# Patient Record
Sex: Male | Born: 1990 | Race: Black or African American | Hispanic: No | Marital: Single | Smoking: Never smoker
Health system: Southern US, Community
[De-identification: ages and names within clinical notes are randomized; demographics above are authoritative.]

## PROBLEM LIST (undated history)

## (undated) ENCOUNTER — Ambulatory Visit (HOSPITAL_COMMUNITY): Admission: EM | Payer: Self-pay | Source: Home / Self Care

## (undated) DIAGNOSIS — B182 Chronic viral hepatitis C: Principal | ICD-10-CM

## (undated) HISTORY — DX: Chronic viral hepatitis C: B18.2

---

## 2018-04-07 ENCOUNTER — Other Ambulatory Visit (HOSPITAL_COMMUNITY)
Admission: RE | Admit: 2018-04-07 | Discharge: 2018-04-07 | Disposition: A | Discharge status: Home / Self Care | Payer: Medicaid Other | Source: Ambulatory Visit | Attending: Family Medicine | Admitting: Family Medicine

## 2018-04-07 ENCOUNTER — Ambulatory Visit (INDEPENDENT_AMBULATORY_CARE_PROVIDER_SITE_OTHER): Payer: Medicaid Other | Admitting: Family Medicine

## 2018-04-07 VITALS — BP 98/62 | HR 69 | Temp 97.9°F | Ht 69.0 in | Wt 145.4 lb

## 2018-04-07 DIAGNOSIS — R300 Dysuria: Secondary | ICD-10-CM | POA: Insufficient documentation

## 2018-04-07 DIAGNOSIS — Z0289 Encounter for other administrative examinations: Secondary | ICD-10-CM

## 2018-04-07 DIAGNOSIS — R369 Urethral discharge, unspecified: Secondary | ICD-10-CM | POA: Insufficient documentation

## 2018-04-07 DIAGNOSIS — R309 Painful micturition, unspecified: Secondary | ICD-10-CM | POA: Diagnosis not present

## 2018-04-07 DIAGNOSIS — Z758 Other problems related to medical facilities and other health care: Secondary | ICD-10-CM

## 2018-04-07 DIAGNOSIS — Z789 Other specified health status: Secondary | ICD-10-CM | POA: Insufficient documentation

## 2018-04-07 HISTORY — DX: Encounter for other administrative examinations: Z02.89

## 2018-04-07 MED ORDER — AZITHROMYCIN 500 MG PO TABS: 500.0000 mg | ORAL_TABLET | Freq: Once | ORAL | Status: DC

## 2018-04-07 MED ORDER — CEFTRIAXONE SODIUM 250 MG IJ SOLR
250.0000 mg | Freq: Once | INTRAMUSCULAR | Status: AC
  Administered 2018-04-07: 250 mg via INTRAMUSCULAR

## 2018-04-07 MED ORDER — AZITHROMYCIN 500 MG PO TABS
1000.0000 mg | ORAL_TABLET | Freq: Once | ORAL | Status: AC
  Administered 2018-04-07: 1000 mg via ORAL

## 2018-04-07 NOTE — Patient Instructions (Signed)
Thank you for coming!  We made you an appointment in 2-3 weeks to see how you are doing.

## 2018-04-07 NOTE — Progress Notes (Signed)
Kinyarwanda interpreter [phone]#[id number] utilized during today's visit. Interpreter present (over the phone) for entire patient encounter.   [facility] New [facility] Visit  HPI:  Patient [facility] today for a new patient appointment to establish general primary care, also to discuss pain with urination.   - reports of dysuria since the end of March - also has pus coming from urethra - no hematuria, abdominal pain or flank pain - no fevers or chills - is sexually active and uses condoms irregularly   ROS:  otherwise see HPI  Past Medical Hx:  -none   Past Surgical Hx:  -none   Family Hx: updated in Epic - Number of family members:  6 - Number of family members in [place]: 0; his parents are coming to the [place] soon   Immigrant Social History: - Date arrived in [place]: March 2019 - Country of origin: [place] - Location of refugee camp (if applicable), how long there, and what caused patient to leave home country?: [place] since [place]. Left [place] due to war  - Primary language: Kinyarwanda   -Requires intepreter (essentially speaks no [place]) - Education: Highest level of education: 9th grade - Prior work: in [employer]  - Designer, fashion/clothingTobacco/alcohol/drug use: no tobacco use, reports of occasional alcohol use, no other drug use - Marriage Status: single - Sexual activity: sexually active with women  - Were you beaten or tortured in your country or refugee camp?  no  Preventative Care History: -Seen at health department?: no  PHYSICAL EXAM: BP 98/62   Pulse 69   Temp 97.9 F (36.6 C) (Oral)   Ht 5\' 9"  (1.753 m)   Wt 145 lb 6.4 oz (66 kg)   SpO2 99%   BMI 21.47 kg/m  Gen: NAD, pleasant  HEENT: Atraumatic, normocephalic, neck supple, EOMI, conjunctiva mildly injected bilaterally, PERRL. Oropharynx normal.  Neck:  Supple and no lymphadenopathy  Heart: RRR, no murmurs, rubs, or gallops Lungs: CTAB, normal effort  ABD: Soft, nontender, nondistended, NABS, no organomegaly GU: circumcised;  normal anatomy. No discharge noted from urethra. Tenderness in the left inguinal region without any masses/hernia Skin:  No rash  MSK: able to move all extremities without difficulty  Neuro: Awake, alert, no focal deficits grossly, normal speech Psych: Mood and affect euthymic, normal rate and volume of speech  Examined and interviewed with Dr. Gwendolyn GrantWalden  1. Refugee health examination - Comprehensive metabolic panel - CBC with Differential/Platelet - Urine cytology ancillary only - Varicella zoster antibody, IgG - RPR - HIV antibody - Hepatitis B surface antigen - Hepatitis C antibody - QuantiFERON-TB Gold Plus - Sickle cell screen  2. Urinary pain Sexually active patient with dysuria and penile discharge. Will treat empirically with CTX 250mg  IM x 1 and Azithromycin 1g PO x1. Follow up visit made in 2 weeks to discuss lab work and obtain test of cure if appropriate  - cefTRIAXone (ROCEPHIN) injection 250 mg - azithromycin (ZITHROMAX) tablet 1,000 mg  3. Language barrier

## 2018-04-08 ENCOUNTER — Encounter: Payer: Self-pay | Admitting: Family Medicine

## 2018-04-08 LAB — URINE CYTOLOGY ANCILLARY ONLY
Chlamydia: POSITIVE — AB
Neisseria Gonorrhea: NEGATIVE

## 2018-04-09 ENCOUNTER — Telehealth: Payer: Self-pay | Admitting: Family Medicine

## 2018-04-09 ENCOUNTER — Telehealth: Payer: Self-pay | Admitting: *Deleted

## 2018-04-09 NOTE — Telephone Encounter (Signed)
Fax[facility] Communicable Disease [facility] [employer].  Brandon SakaiZimmerman Wolfe, Brandon Wolfe, [place]

## 2018-04-09 NOTE — Telephone Encounter (Signed)
Attempted to call patient through [facility] [place] interpreter Jill AlexandersJustin [phone] but number on file was invalid.  He has an appt to see us the week after next.  We can refer to ID at that appt and have a STI test of cure.  If he doesn't come to this appt we will need to send a certified letter.

## 2018-04-10 LAB — COMPREHENSIVE METABOLIC PANEL
ALT: 88 IU/L — ABNORMAL HIGH (ref 0–44)
AST: 58 IU/L — ABNORMAL HIGH (ref 0–40)
Albumin/Globulin Ratio: 1.3 (ref 1.2–2.2)
Albumin: 4.7 g/dL (ref 3.5–5.5)
Alkaline Phosphatase: 53 IU/L (ref 39–117)
BUN/Creatinine Ratio: 11 (ref 9–20)
BUN: 8 mg/dL (ref 6–20)
Bilirubin Total: 0.3 mg/dL (ref 0.0–1.2)
CO2: 26 mmol/L (ref 20–29)
Calcium: 9.5 mg/dL (ref 8.7–10.2)
Chloride: 98 mmol/L (ref 96–106)
Creatinine, Ser: 0.74 mg/dL — ABNORMAL LOW (ref 0.76–1.27)
GFR calc Af Amer: 147 mL/min/{1.73_m2} (ref >59)
GFR calc non Af Amer: 127 mL/min/{1.73_m2} (ref >59)
Globulin, Total: 3.7 g/dL (ref 1.5–4.5)
Glucose: 101 mg/dL — ABNORMAL HIGH (ref 65–99)
Potassium: 4.6 mmol/L (ref 3.5–5.2)
Sodium: 138 mmol/L (ref 134–144)
Total Protein: 8.4 g/dL (ref 6.0–8.5)

## 2018-04-10 LAB — CBC WITH DIFFERENTIAL/PLATELET
Basophils Absolute: 0 10*3/uL (ref 0.0–0.2)
Basos: 1 %
EOS (ABSOLUTE): 0 10*3/uL (ref 0.0–0.4)
Eos: 1 %
Hematocrit: 46.7 % (ref 37.5–51.0)
Hemoglobin: 15.5 g/dL (ref 13.0–17.7)
Immature Grans (Abs): 0 10*3/uL (ref 0.0–0.1)
Immature Granulocytes: 0 %
Lymphocytes Absolute: 1.8 10*3/uL (ref 0.7–3.1)
Lymphs: 43 %
MCH: 29 pg (ref 26.6–33.0)
MCHC: 33.2 g/dL (ref 31.5–35.7)
MCV: 88 fL (ref 79–97)
Monocytes Absolute: 0.4 10*3/uL (ref 0.1–0.9)
Monocytes: 11 %
Neutrophils Absolute: 1.9 10*3/uL (ref 1.4–7.0)
Neutrophils: 44 %
Platelets: 231 10*3/uL (ref 150–379)
RBC: 5.34 x10E6/uL (ref 4.14–5.80)
RDW: 12.9 % (ref 12.3–15.4)
WBC: 4.2 10*3/uL (ref 3.4–10.8)

## 2018-04-10 LAB — SICKLE CELL SCREEN: Sickle Cell Screen: NEGATIVE

## 2018-04-10 LAB — VARICELLA ZOSTER ANTIBODY, IGG: Varicella zoster IgG: 293 index (ref >165)

## 2018-04-10 LAB — QUANTIFERON-TB GOLD PLUS
QuantiFERON Mitogen Value: >10 IU/mL
QuantiFERON Nil Value: 0.06 IU/mL
QuantiFERON TB1 Ag Value: 0.28 IU/mL
QuantiFERON TB2 Ag Value: 0.37 IU/mL
QuantiFERON-TB Gold Plus: NEGATIVE

## 2018-04-10 LAB — HEPATITIS C ANTIBODY

## 2018-04-10 LAB — RPR: RPR: NONREACTIVE

## 2018-04-10 LAB — HIV ANTIBODY (ROUTINE TESTING W REFLEX): HIV Screen 4th Generation wRfx: NONREACTIVE

## 2018-04-10 LAB — HEPATITIS B SURFACE ANTIGEN: Hepatitis B Surface Ag: NEGATIVE

## 2018-04-21 ENCOUNTER — Other Ambulatory Visit: Payer: Self-pay

## 2018-04-21 ENCOUNTER — Encounter: Payer: Self-pay | Admitting: Internal Medicine

## 2018-04-21 ENCOUNTER — Ambulatory Visit (INDEPENDENT_AMBULATORY_CARE_PROVIDER_SITE_OTHER): Payer: Medicaid Other | Admitting: Internal Medicine

## 2018-04-21 VITALS — BP 122/64 | HR 77 | Temp 98.1°F | Wt 140.0 lb

## 2018-04-21 DIAGNOSIS — R768 Other specified abnormal immunological findings in serum: Secondary | ICD-10-CM | POA: Diagnosis not present

## 2018-04-21 DIAGNOSIS — H9193 Unspecified hearing loss, bilateral: Secondary | ICD-10-CM

## 2018-04-21 DIAGNOSIS — M542 Cervicalgia: Secondary | ICD-10-CM | POA: Diagnosis not present

## 2018-04-21 DIAGNOSIS — R7689 Other specified abnormal immunological findings in serum: Secondary | ICD-10-CM

## 2018-04-21 DIAGNOSIS — R300 Dysuria: Secondary | ICD-10-CM

## 2018-04-21 LAB — POCT URINALYSIS DIP (MANUAL ENTRY)
Bilirubin, UA: NEGATIVE
Glucose, UA: NEGATIVE mg/dL
Leukocytes, UA: NEGATIVE
Nitrite, UA: NEGATIVE
RBC UA: NEGATIVE
SPEC GRAV UA: 1.025 (ref 1.010–1.025)
Urobilinogen, UA: 4 E.U./dL — AB
pH, UA: 7 (ref 5.0–8.0)

## 2018-04-21 MED ORDER — IBUPROFEN 400 MG PO TABS: 400.0000 mg | ORAL_TABLET | Freq: Three times a day (TID) | ORAL | 0 refills | Status: AC | PRN

## 2018-04-21 NOTE — Progress Notes (Signed)
[facility] Phone: [phone]   Date of Visit: 04/21/2018   HPI:  Phone interpreter used for this visit: [phone]  Patient is here to discuss lab results from the prior visit.  He also reports of difficulty hearing and neck pain.  Difficulty with hearing: -Patient reports of difficulty with hearing since 2002 -Hearing is worse in the right ear -He has ringing in his right ear and feels like his ears are hazy -He has intermittent pain in his right ear  Neck pain: -Reports of neck pain and occipital pain since 2014.  He states that he did a lot of heavy lifting while he was at the refugee camp.  Reports that he has neck pain while he is looking down at his papers in class.  At one point he received some topical medication which did help but he does not recall the name of this medication.  Chlamydia positive: -We discussed that his test was positive for chlamydia at the last visit.  He received appropriate treatment at the last visit.  Hepatitis C antibody positive: -We discussed that his hepatitis C antibody is positive and that he will need further evaluation by a GI doctor. -He denies any IV drug use -Denies any abdominal pain.  His liver function tests from the last visit were significant for mildly elevated LFTs with AST at 58 and ALT at 88  Of note patient still reports he has burning with urination.  This has not improved since he was treated empirically for possible gonorrhea and chlamydia and was found to be chlamydia positive.  We did not have time to discuss this fully in detail due to time spent discussing the above mentioned topics  ROS: See HPI.  PMFSH:  Refugee Language barrier  PHYSICAL EXAM: BP 122/64   Pulse 77   Temp 98.1 F (36.7 C) (Oral)   Wt 140 lb (63.5 kg)   SpO2 99%   BMI 20.67 kg/m  GEN: NAD HEENT: Atraumatic, normocephalic, neck supple, EOMI, sclera clear, tympanic membranes: Right tympanic membrane with intact eardrum  but difficult to see the middle ear structures as it is dark behind the tympanic membrane.  Left tympanic membrane appears to be intact but difficult to see the middle ear structures as it is also dark behind the tympanic membrane.  No pain with movement of pinna MSK: No midline tenderness of the cervical spine, some paraspinal tenderness of the cervical region, normal range of motion of the neck, Spurling test negative.  CV: RRR, no murmurs, rubs, or gallops PULM: CTAB, normal effort ABD: Soft, nontender, nondistended, NABS, no organomegaly SKIN: No rash or cyanosis; warm and well-perfused PSYCH: Mood and affect euthymic, normal rate and volume of speech NEURO: Awake, alert, no focal deficits grossly, normal speech;   ASSESSMENT/PLAN:  Bilateral hearing loss: Patient failed hearing screen in clinic.  Due to findings on physical exam as well as a hearing screen, will refer to ENT  Hepatitis C antibody positive and blood: Discussed with patient regarding this.  Will refer to GI for further evaluation.  Neck pain: Appears to be muscular strain.  No red flags.  Will do trial of ibuprofen 400 mg 3 times daily as needed  Dysuria: Then did not have a lot of time to discuss this.  Symptoms have not resolved since being treated for STI.  Will obtain a urinalysis and urine culture for further evaluation.  Follow-up in 2 weeks  Palma Holter, MD PGY 3 [place]  [facility] Medicine

## 2018-04-21 NOTE — Patient Instructions (Signed)
Please take Ibuprofen as needed for your neck pain.   We will do a urine sample  I made a referral to the ear nose and throat doctor and the infectious disease doctor

## 2018-04-23 ENCOUNTER — Encounter: Payer: Self-pay | Admitting: Internal Medicine

## 2018-04-23 LAB — URINE CULTURE

## 2018-05-19 ENCOUNTER — Encounter: Payer: Self-pay | Admitting: Internal Medicine

## 2018-05-19 DIAGNOSIS — A568 Sexually transmitted chlamydial infection of other sites: Secondary | ICD-10-CM

## 2018-05-19 HISTORY — DX: Sexually transmitted chlamydial infection of other sites: A56.8

## 2018-05-20 ENCOUNTER — Ambulatory Visit (INDEPENDENT_AMBULATORY_CARE_PROVIDER_SITE_OTHER): Payer: Medicaid Other | Admitting: Internal Medicine

## 2018-05-20 ENCOUNTER — Ambulatory Visit: Payer: Medicaid Other | Admitting: Internal Medicine

## 2018-05-20 ENCOUNTER — Other Ambulatory Visit: Payer: Self-pay

## 2018-05-20 ENCOUNTER — Encounter: Payer: Self-pay | Admitting: Internal Medicine

## 2018-05-20 VITALS — BP 110/64 | HR 78 | Temp 97.5°F | Ht 69.0 in | Wt 138.0 lb

## 2018-05-20 DIAGNOSIS — B1920 Unspecified viral hepatitis C without hepatic coma: Secondary | ICD-10-CM | POA: Diagnosis present

## 2018-05-20 NOTE — Progress Notes (Signed)
   [facility] Phone: [phone]   Date of Visit: 05/20/2018   HPI:  Follow Up:  -Patient reports he is here to discuss lab results from our recent visit.  At that visit he reported that he continued to have  dysuria even after he was treated for chlamydia.  Will obtain a urinalysis which did not show any infectious process but did show some urobilinogen.  He reports that he no longer has dysuria.  He feels like his urine is more yellow since he has been here in the [place].  Of note, his urinalysis did show 5 ketones as well.  No fevers or chills no abdominal pain, nausea or vomiting. -Per chart review it appears that the ID clinic has been trying to get in touch with the patient.  Language barrier may have caused difficulty setting up an appointment.  ROS: See HPI.  PMFSH:  PMH: Language barrier  PHYSICAL EXAM: BP 110/64   Pulse 78   Temp (!) 97.5 F (36.4 C) (Oral)   Ht  (1.753 m)   Wt 138 lb (62.6 kg)   SpO2 99%   BMI 20.38 kg/m  GEN: NAD HEENT: Atraumatic, normocephalic, neck supple, EOMI, sclera clear  CV: RRR, no murmurs, rubs, or gallops PULM: CTAB, normal effort SKIN: No rash or cyanosis; warm and well-perfused EXTR: No lower extremity edema or calf tenderness PSYCH: Mood and affect euthymic, normal rate and volume of speech NEURO: Awake, alert, no focal deficits grossly, normal speech;  ASSESSMENT/PLAN:  Hepatitis C: We attempted to call the infectious disease clinic to set up an appointment for patient.  However, call sent to voicemail twice.  Provided patient with the address and the phone number for clinic so that he can set up an appointment.  Urine color is likely due to some dehydration.  Urine did have some urobilinogen but he had normal serum bilirubin levels on CMP in April.  Of note we also called ENT and had an appointment set up for him as he was referred to ENT at the last visit.    Palma Holter, MD PGY 3 [place]  [facility] Medicine

## 2018-06-11 ENCOUNTER — Encounter: Payer: Medicaid Other | Admitting: Internal Medicine

## 2018-06-17 ENCOUNTER — Other Ambulatory Visit (HOSPITAL_COMMUNITY)
Admission: RE | Admit: 2018-06-17 | Discharge: 2018-06-17 | Disposition: A | Discharge status: Home / Self Care | Payer: Medicaid Other | Source: Ambulatory Visit | Attending: Infectious Diseases | Admitting: Infectious Diseases

## 2018-06-17 ENCOUNTER — Encounter: Payer: Self-pay | Admitting: Infectious Diseases

## 2018-06-17 ENCOUNTER — Ambulatory Visit (INDEPENDENT_AMBULATORY_CARE_PROVIDER_SITE_OTHER): Payer: Medicaid Other | Admitting: Infectious Diseases

## 2018-06-17 VITALS — BP 109/69 | HR 74 | Temp 98.0°F | Wt 132.0 lb

## 2018-06-17 DIAGNOSIS — Z113 Encounter for screening for infections with a predominantly sexual mode of transmission: Secondary | ICD-10-CM | POA: Insufficient documentation

## 2018-06-17 DIAGNOSIS — R768 Other specified abnormal immunological findings in serum: Secondary | ICD-10-CM

## 2018-06-17 DIAGNOSIS — R82998 Other abnormal findings in urine: Secondary | ICD-10-CM | POA: Insufficient documentation

## 2018-06-17 DIAGNOSIS — A568 Sexually transmitted chlamydial infection of other sites: Secondary | ICD-10-CM

## 2018-06-17 DIAGNOSIS — B182 Chronic viral hepatitis C: Secondary | ICD-10-CM | POA: Insufficient documentation

## 2018-06-17 DIAGNOSIS — N390 Urinary tract infection, site not specified: Secondary | ICD-10-CM

## 2018-06-17 HISTORY — DX: Chronic viral hepatitis C: B18.2

## 2018-06-17 NOTE — Assessment & Plan Note (Signed)
Will repeat his screening today.

## 2018-06-17 NOTE — Progress Notes (Signed)
   Subjective:    Patient ID: Brandon Wolfe, male    DOB: 10/05/1991, 27 y.o.   MRN: [id number]  HPI 27 yo M, recent tx for chlamydia (ceftriaxone/azithro), noted to have positive Ab for Hep C.  He was HIV (-). Quantiferon (-).  He has not had further Hep C testing (genotype, VL).  He had mild elevation of his AST/ALT (85/88).  He beileves that his eyes have changed color (less white) recently.    He was born in [place] and came to [place]. His [place] are still in [place] working on coming to [place]. He is unaware if they have any health problems.   The past medical history, family history and social history were reviewed/updated in EPIC   Review of Systems  Constitutional: Positive for unexpected weight change. Negative for appetite change.  Respiratory: Negative for cough and shortness of breath.   Gastrointestinal: Negative for abdominal pain, blood in stool, constipation and diarrhea.  Musculoskeletal: Positive for arthralgias.       Rib and shoulder pain which he attributes to his work.        Objective:   Physical Exam  Constitutional: He appears well-developed and well-nourished.  HENT:  Mouth/Throat: No oropharyngeal exudate.  Eyes: Pupils are equal, round, and reactive to light. EOM are normal. No scleral icterus.  Neck: Normal range of motion. Neck supple.  Cardiovascular: Normal rate, regular rhythm and normal heart sounds.  Pulmonary/Chest: Effort normal and breath sounds normal.  Abdominal: Soft. Bowel sounds are normal. He exhibits no distension and no mass. There is no tenderness. There is no guarding.  Musculoskeletal: Normal range of motion.  Neurological: He is alert.  Psychiatric: He has a normal mood and affect.       Assessment & Plan:

## 2018-06-17 NOTE — Assessment & Plan Note (Signed)
Will check his viral load, PCR and genotype Will screen him for Hep A and B as well Will hold on checking u/s til we have positive genetic studies.

## 2018-06-18 ENCOUNTER — Encounter: Payer: Self-pay | Admitting: Infectious Diseases

## 2018-06-18 DIAGNOSIS — Z789 Other specified health status: Secondary | ICD-10-CM

## 2018-06-18 HISTORY — DX: Other specified health status: Z78.9

## 2018-06-18 LAB — COMPREHENSIVE METABOLIC PANEL
AG Ratio: 1.3 (calc) (ref 1.0–2.5)
ALKALINE PHOSPHATASE (APISO): 47 U/L (ref 40–115)
ALT: 74 U/L — ABNORMAL HIGH (ref 9–46)
AST: 52 U/L — ABNORMAL HIGH (ref 10–40)
Albumin: 4.5 g/dL (ref 3.6–5.1)
BILIRUBIN TOTAL: 0.5 mg/dL (ref 0.2–1.2)
BUN: 15 mg/dL (ref 7–25)
CALCIUM: 9.5 mg/dL (ref 8.6–10.3)
CHLORIDE: 103 mmol/L (ref 98–110)
CO2: 26 mmol/L (ref 20–32)
CREATININE: 0.9 mg/dL (ref 0.60–1.35)
GLOBULIN: 3.4 g/dL (ref 1.9–3.7)
Glucose, Bld: 97 mg/dL (ref 65–99)
Potassium: 4 mmol/L (ref 3.5–5.3)
Sodium: 137 mmol/L (ref 135–146)
Total Protein: 7.9 g/dL (ref 6.1–8.1)

## 2018-06-18 LAB — URINE CYTOLOGY ANCILLARY ONLY
Chlamydia: NEGATIVE
Neisseria Gonorrhea: NEGATIVE

## 2018-06-18 LAB — RPR: RPR: NONREACTIVE

## 2018-06-18 LAB — HEPATITIS B SURFACE ANTIGEN: HEP B S AG: NONREACTIVE

## 2018-06-18 LAB — PROTIME-INR
INR: 1
PROTHROMBIN TIME: 10.8 s (ref 9.0–11.5)

## 2018-06-18 LAB — HIV ANTIBODY (ROUTINE TESTING W REFLEX): HIV 1&2 Ab, 4th Generation: NONREACTIVE

## 2018-06-18 LAB — HEPATITIS B SURFACE ANTIBODY,QUALITATIVE: Hep B S Ab: REACTIVE — AB

## 2018-06-18 LAB — HEPATITIS A ANTIBODY, TOTAL: HEPATITIS A AB,TOTAL: REACTIVE — AB

## 2018-06-21 LAB — HEPATITIS C GENOTYPE: HCV GENOTYPE: 4

## 2018-06-21 LAB — HEPATITIS C RNA QUANTITATIVE
HCV QUANT LOG: 4.52 {Log_IU}/mL — AB
HCV RNA, PCR, QN: 32800 [IU]/mL — AB

## 2018-06-23 ENCOUNTER — Other Ambulatory Visit: Payer: Self-pay | Admitting: Infectious Diseases

## 2018-06-23 DIAGNOSIS — R768 Other specified abnormal immunological findings in serum: Secondary | ICD-10-CM

## 2018-07-14 ENCOUNTER — Ambulatory Visit (INDEPENDENT_AMBULATORY_CARE_PROVIDER_SITE_OTHER): Payer: Medicaid Other | Admitting: Family Medicine

## 2018-07-14 ENCOUNTER — Other Ambulatory Visit (HOSPITAL_COMMUNITY)
Admission: RE | Admit: 2018-07-14 | Discharge: 2018-07-14 | Disposition: A | Discharge status: Home / Self Care | Payer: Medicaid Other | Source: Ambulatory Visit | Attending: Family Medicine | Admitting: Family Medicine

## 2018-07-14 ENCOUNTER — Encounter: Payer: Self-pay | Admitting: Family Medicine

## 2018-07-14 ENCOUNTER — Other Ambulatory Visit: Payer: Self-pay

## 2018-07-14 VITALS — BP 108/62 | HR 79 | Temp 97.9°F | Ht 69.0 in | Wt 141.0 lb

## 2018-07-14 DIAGNOSIS — N50812 Left testicular pain: Secondary | ICD-10-CM | POA: Diagnosis present

## 2018-07-14 DIAGNOSIS — B182 Chronic viral hepatitis C: Secondary | ICD-10-CM | POA: Diagnosis not present

## 2018-07-14 DIAGNOSIS — N50819 Testicular pain, unspecified: Secondary | ICD-10-CM | POA: Diagnosis not present

## 2018-07-14 DIAGNOSIS — K5909 Other constipation: Secondary | ICD-10-CM | POA: Insufficient documentation

## 2018-07-14 DIAGNOSIS — K5900 Constipation, unspecified: Secondary | ICD-10-CM | POA: Diagnosis not present

## 2018-07-14 MED ORDER — CEFTRIAXONE SODIUM 250 MG IJ SOLR
250.0000 mg | Freq: Once | INTRAMUSCULAR | Status: AC
  Administered 2018-07-14: 250 mg via INTRAMUSCULAR

## 2018-07-14 MED ORDER — AZITHROMYCIN 500 MG PO TABS
1000.0000 mg | ORAL_TABLET | Freq: Once | ORAL | Status: AC
  Administered 2018-07-14: 1000 mg via ORAL

## 2018-07-14 MED ORDER — LACTULOSE 10 GM/15ML PO SOLN: 20.0000 g | Freq: Two times a day (BID) | ORAL | 0 refills | Status: DC | PRN

## 2018-07-14 NOTE — Patient Instructions (Signed)
It was good to see you today.  Take the lactulose once in the morning and once a evening as needed for constipation.  Come back and see me in 2 weeks to make sure that your pain is all better.

## 2018-07-14 NOTE — Assessment & Plan Note (Signed)
Likely secondary to change in diet for being [place] in the [place]. -Docusate plus lactulose.  I used lactulose rather than MiraLAX as this will be covered by Medicaid. -Follow-up in 2 weeks to reassess. -Minimally tender to abdominal palpation but otherwise negative abdomen.

## 2018-07-14 NOTE — Progress Notes (Signed)
Subjective:   Kinyarwandan interpreter Jesusita OkaDan [phone] used today for entire visit:  Brandon Wolfe is a 27 y.o. male who [facility] for const[facility]:  1.  Constipation:  With associated mild abdominal pain.  Lower abdominal cramping when unable to have BM.  Described difficult BM, hard stools.  No encopresis.  Eating and drinking well.  Pain worse in the evening when he needs to pass stool but is unable.  No nausea or vomiting.    2.  Left testicular pain: New problem.  Present for 2 weeks.  Has been positive for chlamydia in past.  Treated, TOC was negative.  However symptoms have returned.  Describes sharp stabbing in left testicle.  Fairly constant pain but not worsened by exertion or Valsalva maneuvers.  Currently denies any penile discharge or dysuria.  No fevers or chills.  No injury to the area.  No masses that he has noted.  ROS as above per HPI.   The following portions of the patient's history were reviewed and updated as appropriate: allergies, current medications, past medical history, family and social history, and problem list. Patient is a nonsmoker.    PMH reviewed.  No past medical history on file. No past surgical history on file.  Medications reviewed. No current outpatient medications on file.   No current facility-administered medications for this visit.      Objective:   Physical Exam BP 108/62   Pulse 79   Temp 97.9 F (36.6 C) (Oral)   Ht 5\' 9"  (1.753 m)   Wt 141 lb (64 kg)   SpO2 99%   BMI 20.82 kg/m  Gen:  Alert, cooperative patient who appears stated age in no acute distress.  Vital signs reviewed. HEENT: EOMI,  MMM Cardiac:  Regular rate and rhythm without murmur auscultated.  Good S1/S2. Pulm:  Clear to auscultation bilaterally with good air movement.  No wheezes or rales noted.   Abd:  Soft/nondistended.  Minimally tender in umbilical and left lower quadrant.  Palpable stool mass noted.  No guarding or rebound. GU: Somewhat tender to  palpation left testicle.  No high riding testicle.  Able to fully palpate and move testicle.  No penile discharge noted.  Circumcised male.  No penile lesions noted.  No mass noted.  No results found for this or any previous visit (from the past 72 hour(s)).

## 2018-07-14 NOTE — Assessment & Plan Note (Addendum)
New problem.   WOrried initially for hernia, what appears to be testicle itself that is painful.  I do not palpate a hernia on examination today.  Patient denies any inguinal tenderness. -Checking GC chlamydia today.  We will also treat him today. -He is to follow-up with me in 2 weeks.  At that point if his GC chlamydia is negative and he is to continue to have pain we will obtain a testicular ultrasound. -The pain is minimal.  There is no evidence of testicular torsion

## 2018-07-15 ENCOUNTER — Encounter: Payer: Self-pay | Admitting: Family Medicine

## 2018-07-15 ENCOUNTER — Encounter: Payer: Self-pay | Admitting: Infectious Diseases

## 2018-07-15 ENCOUNTER — Ambulatory Visit (INDEPENDENT_AMBULATORY_CARE_PROVIDER_SITE_OTHER): Payer: Medicaid Other | Admitting: Infectious Diseases

## 2018-07-15 DIAGNOSIS — B182 Chronic viral hepatitis C: Secondary | ICD-10-CM

## 2018-07-15 LAB — URINE CYTOLOGY ANCILLARY ONLY
CHLAMYDIA, DNA PROBE: NEGATIVE
Neisseria Gonorrhea: NEGATIVE

## 2018-07-15 NOTE — Assessment & Plan Note (Signed)
Seeing pt with Brandon Wolfe He has Medicaid.  Will get his rx from our clinic. Will check his fibrosure, u/s.  Will rtc in 2 months.

## 2018-07-15 NOTE — Assessment & Plan Note (Signed)
Has follow-up already scheduled with Dr. Ninetta LightsHatcher.

## 2018-07-15 NOTE — Progress Notes (Signed)
   Subjective:    Patient ID: Brandon Wolfe, male    DOB: 09/11/1991, 27 y.o.   MRN: [id number]  HPI 27 yo M, born in [place] and came to [place]. Recent tx for chlamydia (ceftriaxone/azithro), noted to have positive Ab for Hep C. He had f/u in ID clinic on 6-26 and has genotype 4 with VL of  32,800.  He was HIV (-). Quantiferon (-).   He is feeling well today.  Has been having abd pain and bloating. He was recently given rx for lactulose.    Review of Systems  Constitutional: Negative for appetite change and unexpected weight change.  Gastrointestinal: Positive for constipation. Negative for diarrhea.  Genitourinary: Negative for difficulty urinating.  Please see HPI. All other systems reviewed and negative.      Objective:   Physical Exam  Constitutional: He is oriented to person, place, and time. He appears well-developed and well-nourished.  HENT:  Mouth/Throat: No oropharyngeal exudate.  Eyes: Pupils are equal, round, and reactive to light. EOM are normal.  Neck: Normal range of motion. Neck supple.  Cardiovascular: Normal rate, regular rhythm and normal heart sounds.  Pulmonary/Chest: Effort normal and breath sounds normal.  Abdominal: Soft. Bowel sounds are normal. He exhibits no distension. There is no tenderness.  Musculoskeletal: He exhibits no edema.  Neurological: He is alert and oriented to person, place, and time.       Assessment & Plan:

## 2018-07-28 ENCOUNTER — Ambulatory Visit
Admission: RE | Admit: 2018-07-28 | Discharge: 2018-07-28 | Disposition: A | Discharge status: Home / Self Care | Payer: Medicaid Other | Source: Ambulatory Visit | Attending: Infectious Diseases | Admitting: Infectious Diseases

## 2018-07-28 DIAGNOSIS — B182 Chronic viral hepatitis C: Secondary | ICD-10-CM

## 2018-07-29 ENCOUNTER — Ambulatory Visit (INDEPENDENT_AMBULATORY_CARE_PROVIDER_SITE_OTHER): Payer: Medicaid Other | Admitting: Family Medicine

## 2018-07-29 ENCOUNTER — Encounter: Payer: Self-pay | Admitting: Family Medicine

## 2018-07-29 ENCOUNTER — Other Ambulatory Visit: Payer: Self-pay

## 2018-07-29 VITALS — BP 102/66 | HR 69 | Temp 98.2°F | Ht 69.0 in | Wt 140.6 lb

## 2018-07-29 DIAGNOSIS — N50812 Left testicular pain: Secondary | ICD-10-CM

## 2018-07-29 DIAGNOSIS — K5900 Constipation, unspecified: Secondary | ICD-10-CM | POA: Diagnosis not present

## 2018-07-29 DIAGNOSIS — N50819 Testicular pain, unspecified: Secondary | ICD-10-CM

## 2018-07-29 NOTE — Assessment & Plan Note (Signed)
Much improved.   Continue lactulose as needed -- hasn't needed this for some time.

## 2018-07-29 NOTE — Patient Instructions (Signed)
It was good to see you today.  I will call you with the results of the ultrasound.    We will make up a plan at that point.

## 2018-07-29 NOTE — Progress Notes (Signed)
Subjective:    Brandon Wolfe is a 27 y.o. male who [facility] today for constipation and testicular pain:  1.  Constipation: Resolved.  Patient experience much improvement with medications prescribed last visit.  He has had no further issues with constipation.  No abdominal pain.  He is eating and drinking well.  No nausea or vomiting.  2.  Testicular pain: Ongoing but somewhat improved.  Remains painful Left testicle.  Worse at night while he is trying to sleep.  Generally does not have much pain during the day.  He has not had intercourse since arriving the [place] so was unsure about pain with intercourse.  No dysuria.  No penile discharge.   ROS as above per HPI.     The following portions of the patient's history were reviewed and updated as appropriate: allergies, current medications, past medical history, family and social history, and problem list. Patient is a nonsmoker.    PMH reviewed.  Past Medical History:  Diagnosis Date  . Chronic hepatitis C without hepatic coma (HCC) 06/17/2018   No past surgical history on file.  Medications reviewed. Current Outpatient Medications  Medication Sig Dispense Refill  . lactulose (CHRONULAC) 10 GM/15ML solution Take 30 mLs (20 g total) by mouth 2 (two) times daily as needed for mild constipation. 240 mL 0   No current facility-administered medications for this visit.      Objective:   Physical Exam BP 102/66   Pulse 69   Temp 98.2 F (36.8 C) (Oral)   Ht 5\' 9"  (1.753 m)   Wt 140 lb 9.6 oz (63.8 kg)   SpO2 97%   BMI 20.76 kg/m  Gen:  Alert, cooperative patient who appears stated age in no acute distress.  Vital signs reviewed. HEENT: EOMI,  MMM GU:  Deferred testicular exam (done last visit)  No results found for this or any previous visit (from the past 72 hour(s)).

## 2018-07-29 NOTE — Assessment & Plan Note (Signed)
Unclear etiology Scrotal US scheduled today  GC/Chlamydia negative last week.   No penile discharge or dysuria.   Will call patient with result.   May need urology referral pending US results.

## 2018-08-05 ENCOUNTER — Telehealth: Payer: Self-pay

## 2018-08-05 NOTE — Telephone Encounter (Signed)
Rinaldo Cloudamela from [facility] called to inform office that patients Elastography and Ultrasound have been canceled. Rinaldo Cloudamela stated patient needs a face to face interpreter in order to have imaging done. Rinaldo Cloudamela recommended patient go [facility] to have Elastography and Ultrasound done. Will inform Dr. Ninetta LightsHatcher and Timmothy SoursAshley Rehner of this call. Lorenso CourierJose L Maldonado, [place]

## 2018-08-06 ENCOUNTER — Other Ambulatory Visit: Payer: Medicaid Other

## 2018-08-06 ENCOUNTER — Other Ambulatory Visit: Payer: Self-pay | Admitting: Infectious Diseases

## 2018-08-06 DIAGNOSIS — R768 Other specified abnormal immunological findings in serum: Secondary | ICD-10-CM

## 2018-08-06 NOTE — Telephone Encounter (Signed)
Done. thanks

## 2018-08-10 ENCOUNTER — Other Ambulatory Visit: Payer: Medicaid Other

## 2018-08-14 ENCOUNTER — Other Ambulatory Visit: Payer: Self-pay

## 2018-08-14 DIAGNOSIS — R768 Other specified abnormal immunological findings in serum: Secondary | ICD-10-CM

## 2018-08-14 NOTE — Progress Notes (Signed)
New order requested

## 2018-08-17 ENCOUNTER — Telehealth: Payer: Self-pay

## 2018-08-17 NOTE — Telephone Encounter (Signed)
[facility] called stating patient was in their office for an ultrasound appointment and his appointment on a different day and was cancelled.  He does not speak English and the patient is confused.  I asked them to send patient over to our office.     Patient arrived very anxious. He does not know why his appointment was cancelled and he was not informed.  [facility] stated they spoke with a family member who speaks [place].  Patient stated he was not aware and has been to [facility] three times and told they could not help him because he did not speak [place]. He has asked not to be rescheduled there.  Evidently there was a break in communication since there is a language barrier. I was not aware [facility] did not have interpretive services. This is a huge disservice to our patient.   I explained [facility] and will schedule visit while he is here with the interpreter on the line.   Address and time given. He was rescheduled for 08-27-18 with instructions of nothing to eat or drink after midnight.  Address and time given.  I walked the patient outside to show him the building and confirmed he knew where to go with interpreter.   Radiology was informed patient will need interpreter.      Laurell Josephsammy K King, RN

## 2018-08-17 NOTE — Telephone Encounter (Signed)
Pt came in to clinic. [employer] contacted for Kinyarwanda interpreterJill Alexanders- Justin ID # [id number]. Pt states she went to his appointment on 07/28/18 at [facility] for Abdominal Ultrasound, but was told he needed to reschedule for a time he could bring an interpreter. US scheduled by infectious disease provider Dr. Ninetta LightsHatcher. Pt advised to contact Dr. Moshe CiproHatcher's office to reschedule ultrasound. Note written for patient per request explaining need for reschedule as well as need for interpreter services at that appointment. Advised via interpreter if Dr. Moshe CiproHatcher's office has any questions can call and I will attempt to further explain. Pt understands and will contact Dr. Moshe CiproHatcher's office Shawna OrleansMeredith B Aylana Hirschfeld, RN

## 2018-08-27 ENCOUNTER — Ambulatory Visit (HOSPITAL_COMMUNITY)
Admission: RE | Admit: 2018-08-27 | Discharge: 2018-08-27 | Disposition: A | Discharge status: Home / Self Care | Payer: BLUE CROSS/BLUE SHIELD | Source: Ambulatory Visit | Attending: Infectious Diseases | Admitting: Infectious Diseases

## 2018-08-27 DIAGNOSIS — K740 Hepatic fibrosis: Secondary | ICD-10-CM | POA: Insufficient documentation

## 2018-08-27 DIAGNOSIS — R768 Other specified abnormal immunological findings in serum: Secondary | ICD-10-CM | POA: Insufficient documentation

## 2018-09-01 ENCOUNTER — Other Ambulatory Visit: Payer: Self-pay | Admitting: Pharmacist

## 2018-09-01 ENCOUNTER — Ambulatory Visit (INDEPENDENT_AMBULATORY_CARE_PROVIDER_SITE_OTHER): Payer: BLUE CROSS/BLUE SHIELD | Admitting: Infectious Diseases

## 2018-09-01 DIAGNOSIS — B182 Chronic viral hepatitis C: Secondary | ICD-10-CM

## 2018-09-01 MED ORDER — GLECAPREVIR-PIBRENTASVIR 100-40 MG PO TABS: 1.0000 | ORAL_TABLET | Freq: Every day | ORAL | 0 refills | Status: DC

## 2018-09-01 MED ORDER — GLECAPREVIR-PIBRENTASVIR 100-40 MG PO TABS: 3.0000 | ORAL_TABLET | Freq: Every day | ORAL | 1 refills | Status: DC

## 2018-09-01 NOTE — Assessment & Plan Note (Addendum)
Spoke with Pharm- will start mavyret for 8 weeks.  Will send in rx to [facility].  Explained via interpreter, with pharm tech.  My great appreciation to pharm.

## 2018-09-01 NOTE — Progress Notes (Signed)
   Subjective:    Patient ID: Brandon Wolfe, male    DOB: November 07, 1991, 27 y.o.   MRN: [id number]  HPI (seen with iPad interpreter) 27 yo M, born in [place] and came to [place] March [place]. Recent tx for chlamydia (ceftriaxone/azithro), noted to have positive Ab for Hep C. He had f/u in ID clinic on 6-26 and has genotype 4 with VL of  32,800.  He was HIV (-). Quantiferon (-).  He had u/s on 9-5 showing F2 and some F3. He has been feeling well.  C/o pain in shoulder and L ribs. Denies injury. He believes this is due to his hepatitis C.     Review of Systems  Constitutional: Negative for appetite change and unexpected weight change.  Gastrointestinal: Negative for blood in stool, constipation and diarrhea.  Genitourinary: Negative for difficulty urinating.  no change in color or urine or stool. Please see HPI. All other systems reviewed and negative.     Objective:   Physical Exam  Constitutional: He appears well-developed and well-nourished.  HENT:  Mouth/Throat: No oropharyngeal exudate.  Eyes: Pupils are equal, round, and reactive to light. EOM are normal.  Neck: Normal range of motion. Neck supple.  Cardiovascular: Normal rate, regular rhythm and normal heart sounds.  Pulmonary/Chest: Effort normal and breath sounds normal.  Abdominal: Soft. Bowel sounds are normal. He exhibits no distension. There is no tenderness.  Musculoskeletal: He exhibits no edema.      Assessment & Plan:

## 2018-09-02 MED FILL — MAVYRET 100-40 MG TABS: 100-40 | 28 days supply | Qty: 84 | Fill #0

## 2018-09-04 ENCOUNTER — Telehealth: Payer: Self-pay | Admitting: Pharmacy Technician

## 2018-09-04 NOTE — Telephone Encounter (Signed)
Have Hep C med for him to pick up.  Using interpreter, no answer, no voicemail.  Tried several times.  He has not started the med yet.

## 2018-09-08 ENCOUNTER — Encounter: Payer: Self-pay | Admitting: Pharmacy Technician

## 2018-09-08 ENCOUNTER — Ambulatory Visit: Payer: Medicaid Other | Admitting: Pharmacy Technician

## 2018-09-08 ENCOUNTER — Telehealth: Payer: Self-pay | Admitting: Pharmacist

## 2018-09-08 NOTE — Telephone Encounter (Signed)
Brandon Wolfe came in today to pick up his first month of Mavyret. He only speaks Kinyar[place], so an interpreter was used.  I counseled him that he needs to take all 3 tablets at the same time with food each day. I also counseled him on potential side effects of fatigue and headache. I emphasized that he needs to take this every day and that he should not miss any doses. [name] verbalized understanding. He will start his medication tomorrow, 09/09/18. We scheduled him to come for his 1 month follow-up and to pick up his 2nd fill of Mavyret on October 14 at 10:45 AM.  Arvilla MarketMelissa Lore, PharmD PGY1 Pharmacy Resident

## 2018-09-08 NOTE — Telephone Encounter (Signed)
murakoze [facility](kinyarwanda for thank you)

## 2018-09-30 MED FILL — MAVYRET 100-40 MG TABS: 100-40 | 28 days supply | Qty: 84 | Fill #1

## 2018-10-05 ENCOUNTER — Ambulatory Visit (INDEPENDENT_AMBULATORY_CARE_PROVIDER_SITE_OTHER): Payer: BLUE CROSS/BLUE SHIELD | Admitting: Pharmacist

## 2018-10-05 DIAGNOSIS — B182 Chronic viral hepatitis C: Secondary | ICD-10-CM | POA: Diagnosis not present

## 2018-10-05 DIAGNOSIS — Z23 Encounter for immunization: Secondary | ICD-10-CM

## 2018-10-05 NOTE — Progress Notes (Signed)
HPI: Brandon Wolfe is a 27 y.o. male who presents to [facility] for his one month follow up for hepatitis C treatment. An interpretor service was used via speaker phone to conduct this visit (interpretor ID [phone]).   Medication: Mavyret x 8 weeks  Start Date: 09/09/18  Hepatitis C Genotype: 4  Fibrosis Score: F2/F3  Hepatitis C RNA: 32,800 (06/17/18)  Patient Active Problem List   Diagnosis Date Noted  . Testicular pain 07/14/2018  . Constipation 07/14/2018  . Hepatitis B immune 06/18/2018  . Hepatitis A immune 06/18/2018  . Chronic hepatitis C without hepatic coma (HCC) 06/17/2018  . Urine positive for Chlamydia trachomatis by PCR 05/19/2018  . Language barrier 04/07/2018  . Refugee health examination 04/07/2018    Patient's Medications  New Prescriptions   No medications on file  Previous Medications   GLECAPREVIR-PIBRENTASVIR (MAVYRET) 100-40 MG TABS    Take 3 tablets by mouth daily with breakfast.   LACTULOSE (CHRONULAC) 10 GM/15ML SOLUTION    Take 30 mLs (20 g total) by mouth 2 (two) times daily as needed for mild constipation.  Modified Medications   No medications on file  Discontinued Medications   No medications on file    Allergies: No Known Allergies  Past Medical History: Past Medical History:  Diagnosis Date  . Chronic hepatitis C without hepatic coma (HCC) 06/17/2018    Social History: Social History   Socioeconomic History  . Marital status: Single    Spouse name: Not on file  . Number of children: Not on file  . Years of education: Not on file  . Highest education level: Not on file  Occupational History  . Not on file  Social Needs  . Financial resource strain: Not on file  . Food insecurity:    Worry: Not on file    Inability: Not on file  . Transportation needs:    Medical: Not on file    Non-medical: Not on file  Tobacco Use  . Smoking status: Never Smoker  . Smokeless tobacco: Never Used  Substance and Sexual Activity  . Alcohol  use: Not Currently  . Drug use: Not Currently  . Sexual activity: Yes  Lifestyle  . Physical activity:    Days per week: Not on file    Minutes per session: Not on file  . Stress: Not on file  Relationships  . Social connections:    Talks on phone: Not on file    Gets together: Not on file    Attends religious service: Not on file    Active member of club or organization: Not on file    Attends meetings of clubs or organizations: Not on file    Relationship status: Not on file  Other Topics Concern  . Not on file  Social History Narrative   Immigrant Social History:   - Date arrived in [place]: March 2019   - Country of origin: [place]   - Location of refugee camp (if applicable), how long there, and what caused patient to leave home country?: [place], since 2005. Left [place] due to war    - Primary language: Kinyarwanda     -Requires intepreter (essentially speaks no [place])   - Education: Highest level of education: 9th grade   - Prior work: in [employer]    - Tobacco/alcohol/drug use: no tobacco use, reports of occasional alcohol use, no other drug use   - Marriage Status: single   - Sexual activity: sexually active with women    -  Were you beaten or tortured in your country or refugee camp?  no    Labs: Hepatitis C Lab Results  Component Value Date   HCVGENOTYPE 4 06/17/2018   HCVRNAPCRQN 32,800 (H) 06/17/2018   Hepatitis B Lab Results  Component Value Date   HEPBSAB REACTIVE (A) 06/17/2018   HEPBSAG NON-REACTIVE 06/17/2018   Hepatitis A Lab Results  Component Value Date   HAV REACTIVE (A) 06/17/2018   HIV Lab Results  Component Value Date   HIV NON-REACTIVE 06/17/2018   HIV Non Reactive 04/07/2018   Lab Results  Component Value Date   CREATININE 0.90 06/17/2018   CREATININE 0.74 (L) 04/07/2018   Lab Results  Component Value Date   AST 52 (H) 06/17/2018   AST 58 (H) 04/07/2018   ALT 74 (H) 06/17/2018   ALT 88 (H) 04/07/2018   INR 1.0 06/17/2018     Assessment: Demontez has been doing well on Mavyret. He has taken medication for 1 month and reports having one dose left. He has not missed any doses. He denies nausea, vomiting, headaches, and fatigue. Patient inquired if he could take the medication without food. The patient was counseled to take all three tablets together with a meal. He reports a mild pain in his side that he describes as sharp. He reports the pain started about a day ago. I counseled patient this pain is likely unrelated to hepatitis C treatment. Patient denies the use of any other medications including the use of multivitamin/divalent cations. Patient was given second refill of medication in office today. Patient received the influenza vaccine today. He will follow up with Cassie for EOT visit in a month.   Plan: -Continue Mavyret -HCV viral load today and CMP today  -influenza vaccine today -F/u with Cassie on 11/15 @10AM  for EOT visit   Amanda Pea, Pharm D PGY1 Pharmacy Resident  10/05/2018      10:10 AM

## 2018-10-07 LAB — COMPREHENSIVE METABOLIC PANEL
AG RATIO: 1.4 (calc) (ref 1.0–2.5)
ALBUMIN MSPROF: 4.6 g/dL (ref 3.6–5.1)
ALKALINE PHOSPHATASE (APISO): 43 U/L (ref 40–115)
ALT: 14 U/L (ref 9–46)
AST: 19 U/L (ref 10–40)
BUN: 14 mg/dL (ref 7–25)
CHLORIDE: 104 mmol/L (ref 98–110)
CO2: 29 mmol/L (ref 20–32)
Calcium: 9.7 mg/dL (ref 8.6–10.3)
Creat: 0.82 mg/dL (ref 0.60–1.35)
GLOBULIN: 3.2 g/dL (ref 1.9–3.7)
Glucose, Bld: 96 mg/dL (ref 65–99)
Potassium: 4.1 mmol/L (ref 3.5–5.3)
Sodium: 138 mmol/L (ref 135–146)
TOTAL PROTEIN: 7.8 g/dL (ref 6.1–8.1)
Total Bilirubin: 0.5 mg/dL (ref 0.2–1.2)

## 2018-10-07 LAB — HEPATITIS C RNA QUANTITATIVE
HCV Quantitative Log: <1.18 Log IU/mL
HCV RNA, PCR, QN: <15 IU/mL

## 2018-10-12 ENCOUNTER — Ambulatory Visit: Payer: Medicaid Other

## 2018-11-05 ENCOUNTER — Ambulatory Visit (INDEPENDENT_AMBULATORY_CARE_PROVIDER_SITE_OTHER): Payer: BLUE CROSS/BLUE SHIELD | Admitting: Pharmacist

## 2018-11-05 DIAGNOSIS — B182 Chronic viral hepatitis C: Secondary | ICD-10-CM | POA: Diagnosis not present

## 2018-11-05 NOTE — Progress Notes (Signed)
HPI: Brandon Wolfe is a 27 y.o. male presentDimas Wolfe for end of treatment hepatitis C follow up. He had genotype 4, F2/F3 and completed 8 weeks of Mavyret.  Patient Active Problem List   Diagnosis Date Noted  . Testicular pain 07/14/2018  . Constipation 07/14/2018  . Hepatitis B immune 06/18/2018  . Hepatitis A immune 06/18/2018  . Chronic hepatitis C without hepatic coma (HCC) 06/17/2018  . Urine positive for Chlamydia trachomatis by PCR 05/19/2018  . Language barrier 04/07/2018  . Refugee health examination 04/07/2018    Patient's Medications  New Prescriptions   No medications on file  Previous Medications   GLECAPREVIR-PIBRENTASVIR (MAVYRET) 100-40 MG TABS    Take 3 tablets by mouth daily with breakfast.   LACTULOSE (CHRONULAC) 10 GM/15ML SOLUTION    Take 30 mLs (20 g total) by mouth 2 (two) times daily as needed for mild constipation.  Modified Medications   No medications on file  Discontinued Medications   No medications on file    Allergies: No Known Allergies  Past Medical History: Past Medical History:  Diagnosis Date  . Chronic hepatitis C without hepatic coma (HCC) 06/17/2018    Social History: Social History   Socioeconomic History  . Marital status: Single    Spouse name: Not on file  . Number of children: Not on file  . Years of education: Not on file  . Highest education level: Not on file  Occupational History  . Not on file  Social Needs  . Financial resource strain: Not on file  . Food insecurity:    Worry: Not on file    Inability: Not on file  . Transportation needs:    Medical: Not on file    Non-medical: Not on file  Tobacco Use  . Smoking status: Never Smoker  . Smokeless tobacco: Never Used  Substance and Sexual Activity  . Alcohol use: Not Currently  . Drug use: Not Currently  . Sexual activity: Yes  Lifestyle  . Physical activity:    Days per week: Not on file    Minutes per session: Not on file  . Stress: Not on file    Relationships  . Social connections:    Talks on phone: Not on file    Gets together: Not on file    Attends religious service: Not on file    Active member of club or organization: Not on file    Attends meetings of clubs or organizations: Not on file    Relationship status: Not on file  Other Topics Concern  . Not on file  Social History Narrative   Immigrant Social History:   - Date arrived in [place]: March 2019   - Country of origin: [place]   - Location of refugee camp (if applicable), how long there, and what caused patient to leave home country?: [place] since 2005. Left [place] due to war    - Primary language: Kinyarwanda     -Requires intepreter (essentially speaks no [place])   - Education: Highest level of education: 9th grade   - Prior work: in [employer]    - Tobacco/alcohol/drug use: no tobacco use, reports of occasional alcohol use, no other drug use   - Marriage Status: single   - Sexual activity: sexually active with women    - Were you beaten or tortured in your country or refugee camp?  no    Labs: Hepatitis C Lab Results  Component Value Date   HCVGENOTYPE 4 06/17/2018   HCVRNAPCRQN <  15 NOT DETECTED 10/05/2018   HCVRNAPCRQN 32,800 (H) 06/17/2018   Hepatitis B Lab Results  Component Value Date   HEPBSAB REACTIVE (A) 06/17/2018   HEPBSAG NON-REACTIVE 06/17/2018   Hepatitis A Lab Results  Component Value Date   HAV REACTIVE (A) 06/17/2018   HIV Lab Results  Component Value Date   HIV NON-REACTIVE 06/17/2018   HIV Non Reactive 04/07/2018   Lab Results  Component Value Date   CREATININE 0.82 10/05/2018   CREATININE 0.90 06/17/2018   CREATININE 0.74 (L) 04/07/2018   Lab Results  Component Value Date   AST 19 10/05/2018   AST 52 (H) 06/17/2018   AST 58 (H) 04/07/2018   ALT 14 10/05/2018   ALT 74 (H) 06/17/2018   ALT 88 (H) 04/07/2018   INR 1.0 06/17/2018    Assessment: Brandon Wolfe is here for EOT hepatitis C follow up. An interpreter was used  via telephone to conduct this visit. Brandon Wolfe finished 8 weeks of Mavyret on Monday of this week. He denies any side effects during treatment and confirms that he took all three tablets at once with food. He did not miss any doses. His HCV RNA was undetectable during treatment. We will check a viral load again today and at [facility] for definitive cure.  Brandon Wolfe had questions regarding his Medicaid, which he says will be terminated after 8 months. We encouraged him to discuss this with his case worker. He may require assistance for labs at his next visit.   Plan: HCV RNA F/u for cure visit on 2/13 at 1000  Erin N. Zigmund Daniel, PharmD PGY2 Infectious Diseases Pharmacy Resident Phone: [phone] 11/05/2018, 9:54 AM

## 2018-11-06 ENCOUNTER — Ambulatory Visit: Payer: BLUE CROSS/BLUE SHIELD

## 2018-11-09 LAB — HEPATITIS C RNA QUANTITATIVE
HCV QUANT LOG: NOT DETECTED {Log_IU}/mL
HCV RNA, PCR, QN: <15 IU/mL

## 2019-01-27 ENCOUNTER — Ambulatory Visit (INDEPENDENT_AMBULATORY_CARE_PROVIDER_SITE_OTHER): Payer: BLUE CROSS/BLUE SHIELD | Admitting: Family Medicine

## 2019-01-27 ENCOUNTER — Encounter: Payer: Self-pay | Admitting: Family Medicine

## 2019-01-27 ENCOUNTER — Other Ambulatory Visit (HOSPITAL_COMMUNITY)
Admission: RE | Admit: 2019-01-27 | Discharge: 2019-01-27 | Disposition: A | Discharge status: Home / Self Care | Payer: BLUE CROSS/BLUE SHIELD | Source: Ambulatory Visit | Attending: Family Medicine | Admitting: Family Medicine

## 2019-01-27 ENCOUNTER — Other Ambulatory Visit: Payer: Self-pay

## 2019-01-27 VITALS — BP 102/62 | HR 73 | Temp 98.1°F | Wt 153.4 lb

## 2019-01-27 DIAGNOSIS — R300 Dysuria: Secondary | ICD-10-CM

## 2019-01-27 DIAGNOSIS — N50819 Testicular pain, unspecified: Secondary | ICD-10-CM | POA: Diagnosis not present

## 2019-01-27 LAB — POCT URINALYSIS DIP (MANUAL ENTRY)
BILIRUBIN UA: NEGATIVE
GLUCOSE UA: NEGATIVE mg/dL
Ketones, POC UA: NEGATIVE mg/dL
Leukocytes, UA: NEGATIVE
Nitrite, UA: NEGATIVE
PH UA: 7 (ref 5.0–8.0)
Protein Ur, POC: NEGATIVE mg/dL
RBC UA: NEGATIVE
Spec Grav, UA: 1.015 (ref 1.010–1.025)
Urobilinogen, UA: 0.2 E.U./dL

## 2019-01-27 NOTE — Progress Notes (Signed)
Subjective  Brandon Wolfe is a 28 y.o. male is presenting with the following  PENIS TESTICLE PAIN On and off for months.  No discharge or sores but he feels he has sores inside his bladder and penis and that his urine is too yellow and it hurts when urinates.  Last sex contact 2-3 months ago and used a condom.  He is concerned about a contact he had last March who might have had STDs.  No fevers or weight loss or general fatigue   Has been seen several times by Dr Gwendolyn Grant for similar complaints.  Negative STD check in July. [place] of testicle was ordered but apparently not done    Chief Complaint noted Review of Symptoms - see HPI PMH - Smoking status noted.    Objective Vital Signs reviewed BP 102/62   Pulse 73   Temp 98.1 F (36.7 C) (Oral)   Wt 153 lb 6.4 oz (69.6 kg)   SpO2 99%   BMI 22.65 kg/m  GU - circumcised male, shaved pubis, no lesions,  no penile discharge. No palpable testicle abnormality.  Seems to be diffusely tender bilateral testicles more with palpation.  Is able to move around room and get on exam table without any distress  Assessments/Plans  See after visit summary for details of patient instuctions  Testicular pain This time with dysuria and concerns about urine and bladder.  UA normal.   He has bilateral testes tenderness but not focal to epididymis or body of testicle.  No signs of infection on exam.  Culture sent.  Given no recent concerning exposures and normal exam will not treat for STD today.  Have him follow up soon with PCP to determine if more work up is needed

## 2019-01-27 NOTE — Patient Instructions (Addendum)
Good to see you today!  Thanks for coming in.  Your urine is normal  We will do cultures to see if you have any infection.  If we see infection we will call you   Come back to see Dr Gwendolyn Grant

## 2019-01-27 NOTE — Assessment & Plan Note (Signed)
This time with dysuria and concerns about urine and bladder.  UA normal.   He has bilateral testes tenderness but not focal to epididymis or body of testicle.  No signs of infection on exam.  Culture sent.  Given no recent concerning exposures and normal exam will not treat for STD today.  Have him follow up soon with PCP to determine if more work up is needed

## 2019-01-28 LAB — URINE CYTOLOGY ANCILLARY ONLY
CHLAMYDIA, DNA PROBE: NEGATIVE
Neisseria Gonorrhea: NEGATIVE

## 2019-02-02 ENCOUNTER — Other Ambulatory Visit: Payer: Self-pay

## 2019-02-02 ENCOUNTER — Ambulatory Visit: Payer: BLUE CROSS/BLUE SHIELD | Admitting: Family Medicine

## 2019-02-02 ENCOUNTER — Encounter: Payer: Self-pay | Admitting: Family Medicine

## 2019-02-02 VITALS — BP 106/72 | HR 60 | Temp 97.8°F | Ht 69.0 in | Wt 156.2 lb

## 2019-02-02 DIAGNOSIS — R300 Dysuria: Secondary | ICD-10-CM

## 2019-02-02 DIAGNOSIS — N342 Other urethritis: Secondary | ICD-10-CM | POA: Diagnosis not present

## 2019-02-02 DIAGNOSIS — N50819 Testicular pain, unspecified: Secondary | ICD-10-CM | POA: Diagnosis not present

## 2019-02-02 DIAGNOSIS — A568 Sexually transmitted chlamydial infection of other sites: Secondary | ICD-10-CM

## 2019-02-02 MED ORDER — CEFTRIAXONE SODIUM 250 MG IJ SOLR
250.0000 mg | Freq: Once | INTRAMUSCULAR | Status: AC
  Administered 2019-02-02: 250 mg via INTRAMUSCULAR

## 2019-02-02 MED ORDER — METRONIDAZOLE 500 MG PO TABS: 2000.0000 mg | ORAL_TABLET | Freq: Once | ORAL | 0 refills | Status: AC

## 2019-02-02 MED ORDER — AZITHROMYCIN 500 MG PO TABS
1000.0000 mg | ORAL_TABLET | Freq: Once | ORAL | Status: AC
  Administered 2019-02-02: 1000 mg via ORAL

## 2019-02-02 NOTE — Progress Notes (Signed)
Subjective:    Brandon Wolfe is a 28 y.o. male who presents to [facility] today for dysuria:  1.  Dysuria:  Persistent symptoms since coming to [place].  Alternating penile pain with scrotal/testicular pain.  He denies any penile discharge.  Also with some dysuria.  Although he does not denies any urinary frequency or hesitancy.  No lesions that he is noted.  Fairly constant and occurs every day.  Usually worse in the morning when he wakes up and at the end of the day while he was at work.  He has been tested in the past for STDs.  Initialy chlamydia was positive after arriving in [place].  All TOC's have been negative since then.  Same with urinalyses.  No fevers or chills.  No back pain.  No perineal pain.  No pain with prolonged sitting.  Intermittent constipation.  Has known hepatitis but HCV quantitative log has been negative last several testings.    ROS as above per HPI.    The following portions of the patient's history were reviewed and updated as appropriate: allergies, current medications, past medical history, family and social history, and problem list. Patient is a nonsmoker.    PMH reviewed.  Past Medical History:  Diagnosis Date  . Chronic hepatitis C without hepatic coma (HCC) 06/17/2018   No past surgical history on file.  Medications reviewed. Current Outpatient Medications  Medication Sig Dispense Refill  . Glecaprevir-Pibrentasvir (MAVYRET) 100-40 MG TABS Take 3 tablets by mouth daily with breakfast. 84 tablet 1  . lactulose (CHRONULAC) 10 GM/15ML solution Take 30 mLs (20 g total) by mouth 2 (two) times daily as needed for mild constipation. 240 mL 0   No current facility-administered medications for this visit.      Objective:   Physical Exam BP 106/72   Pulse 60   Temp 97.8 F (36.6 C) (Oral)   Ht 5\' 9"  (1.753 m)   Wt 156 lb 3.2 oz (70.9 kg)   SpO2 99%   BMI 23.07 kg/m  Gen:  Alert, cooperative patient who appears stated age in no acute distress.  Vital signs  reviewed. HEENT: EOMI,  MMM GU:  deferred Abd:  Soft/nondistended/nontender.    No results found for this or any previous visit (from the past 72 hour(s)).

## 2019-02-02 NOTE — Patient Instructions (Signed)
Take the Metronidazole pills all at one time.    ------------------------------------------------------------------------  My doctor wants me to have an ultrasound.  Please help me to find radiology department.  I need a [place] interpreter

## 2019-02-04 ENCOUNTER — Encounter: Payer: Self-pay | Admitting: Family Medicine

## 2019-02-04 ENCOUNTER — Ambulatory Visit (INDEPENDENT_AMBULATORY_CARE_PROVIDER_SITE_OTHER): Payer: BLUE CROSS/BLUE SHIELD | Admitting: Pharmacist

## 2019-02-04 DIAGNOSIS — B182 Chronic viral hepatitis C: Secondary | ICD-10-CM | POA: Diagnosis not present

## 2019-02-04 DIAGNOSIS — N342 Other urethritis: Secondary | ICD-10-CM | POA: Insufficient documentation

## 2019-02-04 NOTE — Assessment & Plan Note (Signed)
Negative again today.

## 2019-02-04 NOTE — Assessment & Plan Note (Addendum)
STD testing and UA again negative today.  Epididymitis a concern from chlamydia infection.  Wonder about M. Genitalium infection and therefore negative PCR. Treating today with azithromycin.  Also adding Flagyl 200 mg once on less common chance of longstanding T. Vaginalis as cause of urethritis in this young male with previously positive chlamydia of unknown duration. FU after [place] to assess for improvement.

## 2019-02-04 NOTE — Assessment & Plan Note (Signed)
Ongoing.  Hopefully improved with treatment of urethritis. Testicular US scheduled in case doesn't improve.  May need urologist referral for ongoing symptoms pending [place] results.

## 2019-02-04 NOTE — Progress Notes (Signed)
HPI: Brandon Wolfe is a 28 y.o. male who presents to the [facility] pharmacy clinic for Hepatitis C follow-up.  Medication: Mavyret x 8 weeks finished mid November  Hepatitis C Genotype: 4  Fibrosis Score: F2.F3  Hepatitis C RNA: 32,800 on 06/17/18, <15 on 10/05/18 and 11/05/18  Patient Active Problem List   Diagnosis Date Noted  . Urethritis 02/04/2019  . Testicular pain 07/14/2018  . Constipation 07/14/2018  . Hepatitis B immune 06/18/2018  . Hepatitis A immune 06/18/2018  . Chronic hepatitis C without hepatic coma (HCC) 06/17/2018  . Urine positive for Chlamydia trachomatis by PCR 05/19/2018  . Language barrier 04/07/2018  . Refugee health examination 04/07/2018    Patient's Medications  New Prescriptions   No medications on file  Previous Medications   GLECAPREVIR-PIBRENTASVIR (MAVYRET) 100-40 MG TABS    Take 3 tablets by mouth daily with breakfast.   LACTULOSE (CHRONULAC) 10 GM/15ML SOLUTION    Take 30 mLs (20 g total) by mouth 2 (two) times daily as needed for mild constipation.  Modified Medications   No medications on file  Discontinued Medications   No medications on file    Allergies: No Known Allergies  Past Medical History: Past Medical History:  Diagnosis Date  . Chronic hepatitis C without hepatic coma (HCC) 06/17/2018    Social History: Social History   Socioeconomic History  . Marital status: Single    Spouse name: Not on file  . Number of children: Not on file  . Years of education: Not on file  . Highest education level: Not on file  Occupational History  . Not on file  Social Needs  . Financial resource strain: Not on file  . Food insecurity:    Worry: Not on file    Inability: Not on file  . Transportation needs:    Medical: Not on file    Non-medical: Not on file  Tobacco Use  . Smoking status: Never Smoker  . Smokeless tobacco: Never Used  Substance and Sexual Activity  . Alcohol use: Not Currently  . Drug use: Not Currently  .  Sexual activity: Yes  Lifestyle  . Physical activity:    Days per week: Not on file    Minutes per session: Not on file  . Stress: Not on file  Relationships  . Social connections:    Talks on phone: Not on file    Gets together: Not on file    Attends religious service: Not on file    Active member of club or organization: Not on file    Attends meetings of clubs or organizations: Not on file    Relationship status: Not on file  Other Topics Concern  . Not on file  Social History Narrative   Immigrant Social History:   - Date arrived in [place]: March 2019   - Country of origin: [place]   - Location of refugee camp (if applicable), how long there, and what caused patient to leave home country?: [place], since 2005. Left [place] due to war    - Primary language: Kinyarwanda     -Requires intepreter (essentially speaks no [place])   - Education: Highest level of education: 9th grade   - Prior work: in [employer]    - Tobacco/alcohol/drug use: no tobacco use, reports of occasional alcohol use, no other drug use   - Marriage Status: single   - Sexual activity: sexually active with women    - Were you beaten or tortured in your country or  refugee camp?  no    Labs: Hepatitis C Lab Results  Component Value Date   HCVGENOTYPE 4 06/17/2018   HCVRNAPCRQN <15 NOT DETECTED 11/05/2018   HCVRNAPCRQN <15 NOT DETECTED 10/05/2018   HCVRNAPCRQN 32,800 (H) 06/17/2018   Hepatitis B Lab Results  Component Value Date   HEPBSAB REACTIVE (A) 06/17/2018   HEPBSAG NON-REACTIVE 06/17/2018   Hepatitis A Lab Results  Component Value Date   HAV REACTIVE (A) 06/17/2018   HIV Lab Results  Component Value Date   HIV NON-REACTIVE 06/17/2018   HIV Non Reactive 04/07/2018   Lab Results  Component Value Date   CREATININE 0.82 10/05/2018   CREATININE 0.90 06/17/2018   CREATININE 0.74 (L) 04/07/2018   Lab Results  Component Value Date   AST 19 10/05/2018   AST 52 (H) 06/17/2018   AST 58 (H)  04/07/2018   ALT 14 10/05/2018   ALT 74 (H) 06/17/2018   ALT 88 (H) 04/07/2018   INR 1.0 06/17/2018    Assessment: Brandon Wolfe is here today to follow-up for his chronic Hepatitis C infection. He finished Mavyret x 8 weeks in mid November. His early on treatment and his end of treatment Hep C viral loads were already undetectable.  Will check a final one today for cure.   Plan: - Hep C RNA for cure  Sweet Jarvis L. Jarvis Sawa, PharmD, BCIDP, AAHIVP, CPP Infectious Diseases Clinical Pharmacist [facility] 02/04/2019, 3:04 PM

## 2019-02-06 LAB — HEPATITIS C RNA QUANTITATIVE
HCV QUANT LOG: NOT DETECTED {Log_IU}/mL
HCV RNA, PCR, QN: NOT DETECTED [IU]/mL

## 2019-02-09 ENCOUNTER — Telehealth: Payer: Self-pay

## 2019-02-09 ENCOUNTER — Ambulatory Visit
Admission: RE | Admit: 2019-02-09 | Discharge: 2019-02-09 | Disposition: A | Discharge status: Home / Self Care | Payer: BLUE CROSS/BLUE SHIELD | Source: Ambulatory Visit | Attending: Family Medicine | Admitting: Family Medicine

## 2019-02-09 DIAGNOSIS — N50812 Left testicular pain: Secondary | ICD-10-CM

## 2019-02-09 NOTE — Telephone Encounter (Signed)
Patient walked into clinic today with interpreter for a copy of immunization records. Patient states he needs proof of influenza vaccine for [employer] application. Patient also requesting copy of last labs for personal records. Printed lab report and immunization record for patient. Patient signed release of information during his visit with me for his records. Patient understands that any additional records request will need to go to medical records for processing. Brandon Wolfe, [place]

## 2019-05-13 IMAGING — US US ABDOMEN COMPLETE W/ ELASTOGRAPHY
1 series · 13 of 13 positions shown · non-contrast
Comparison: None.

CLINICAL DATA: Chronic hepatitis-C without hepatic coma.



[Series 1: us abdomen complete w/ elastography · 0.15mm/px · 13 of 13 slices shown]
[im 1/13]
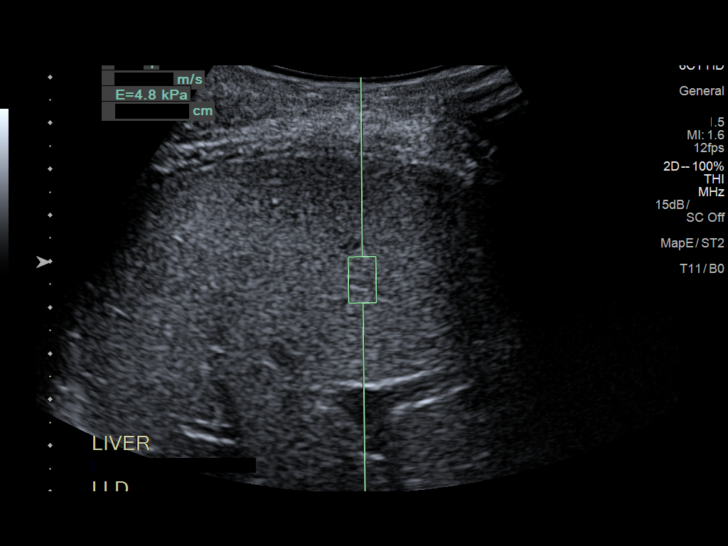
[im 2/13]
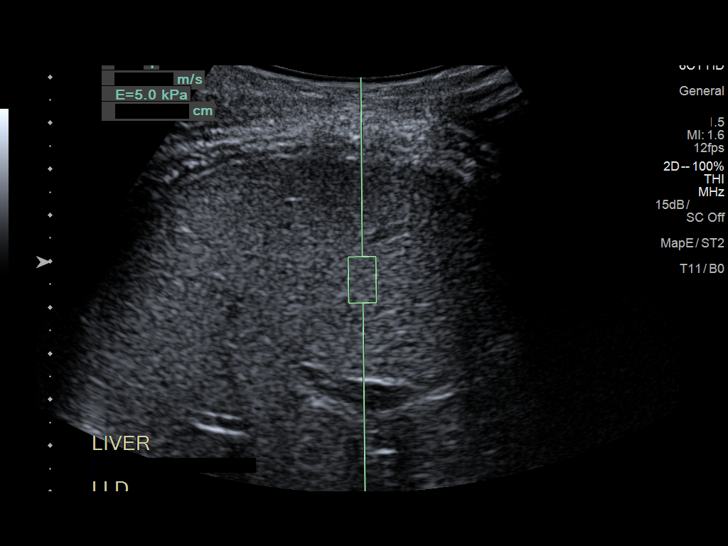
[im 3/13]
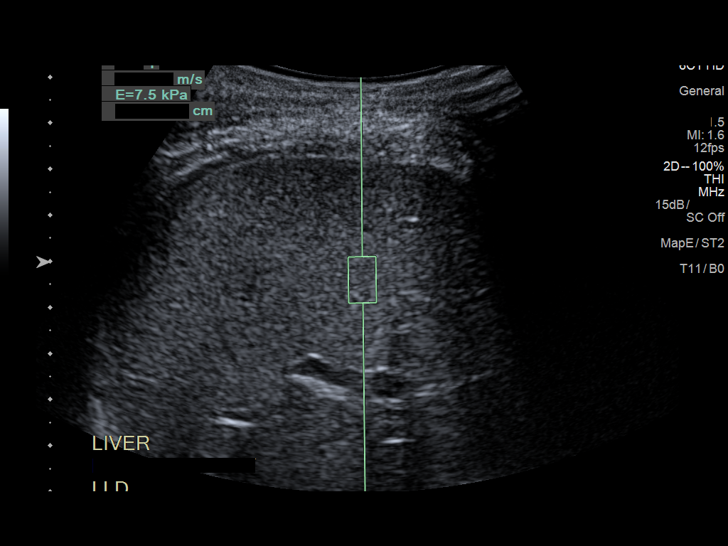
[im 4/13]
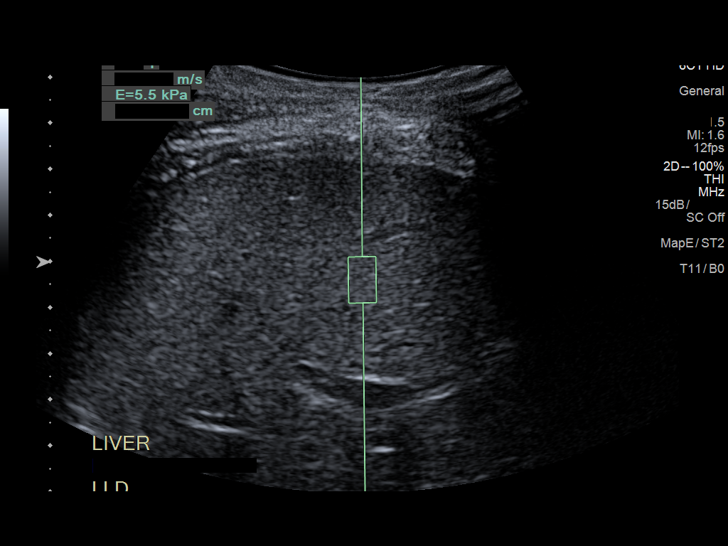
[im 5/13]
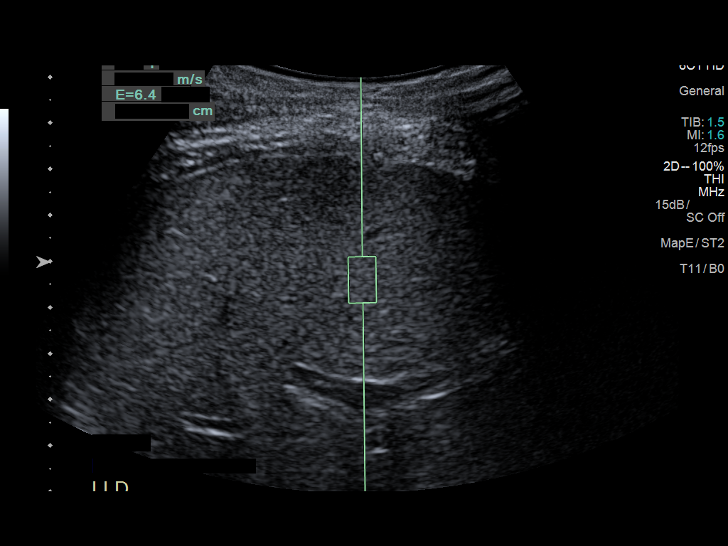
[im 6/13]
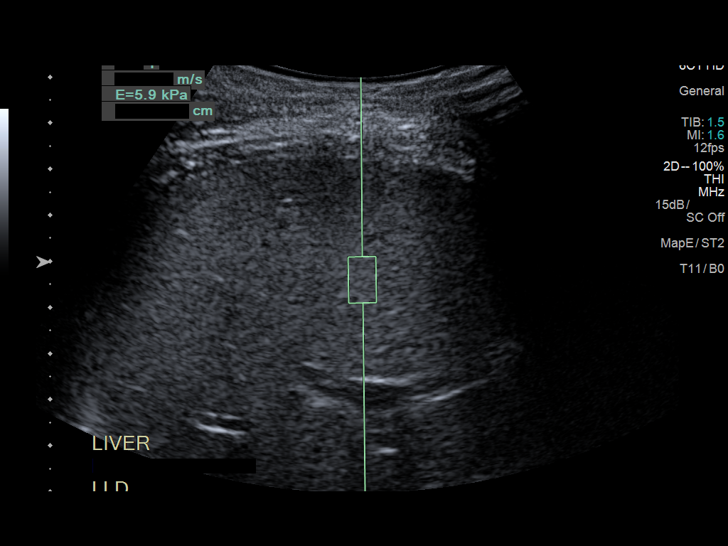
[im 7/13]
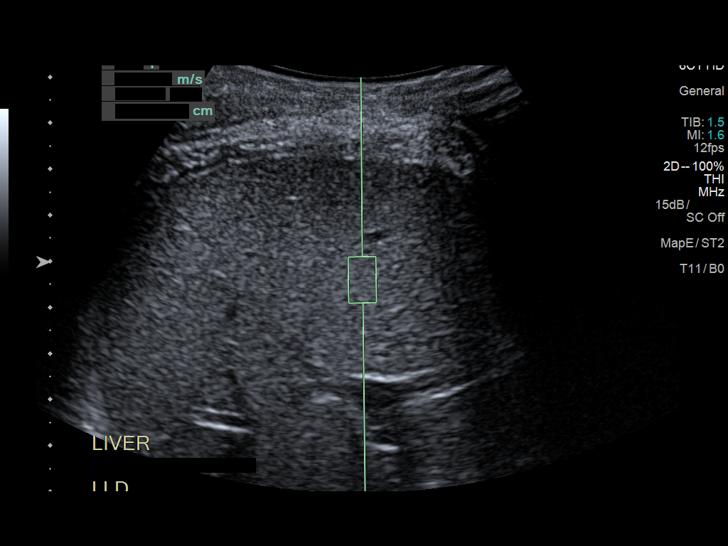
[im 8/13]
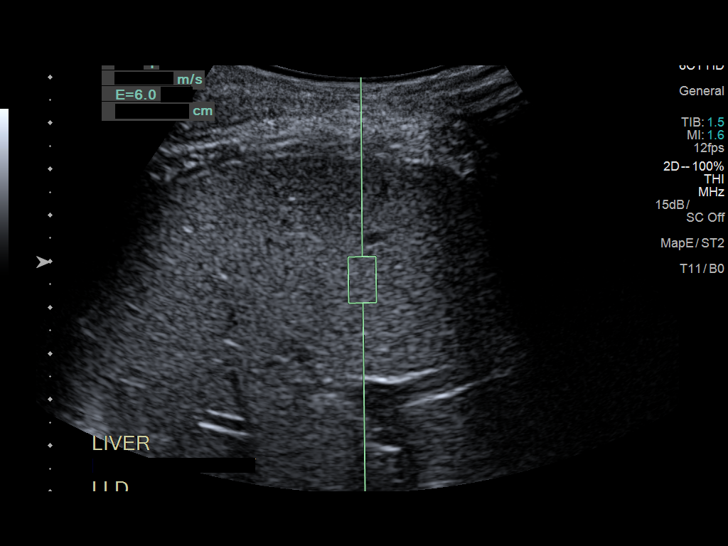
[im 9/13]
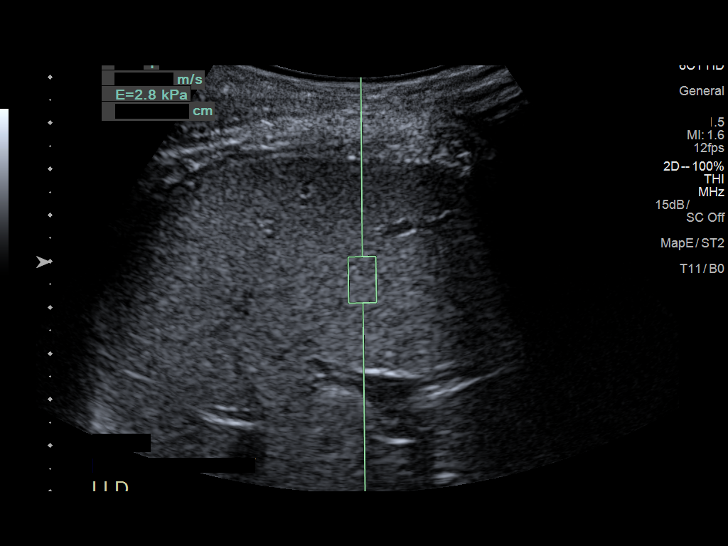
[im 10/13]
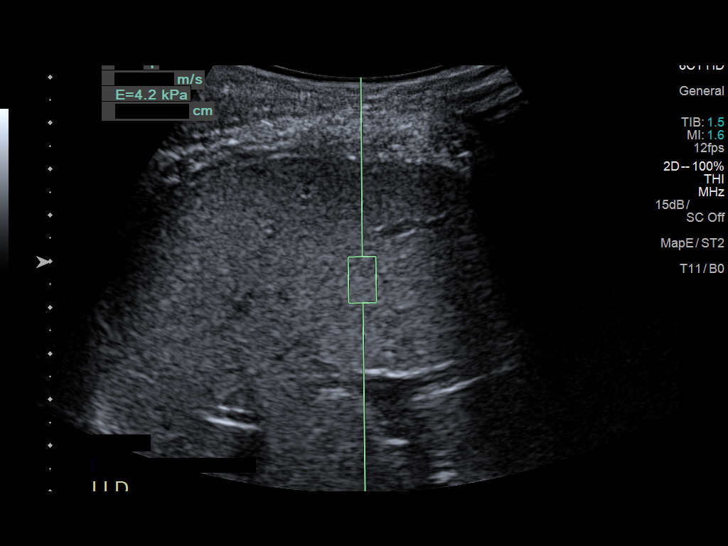
[im 11/13]
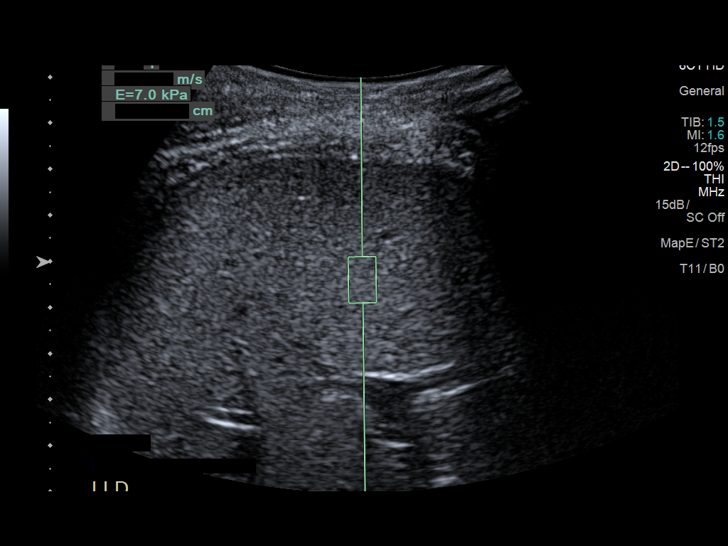
[im 12/13]
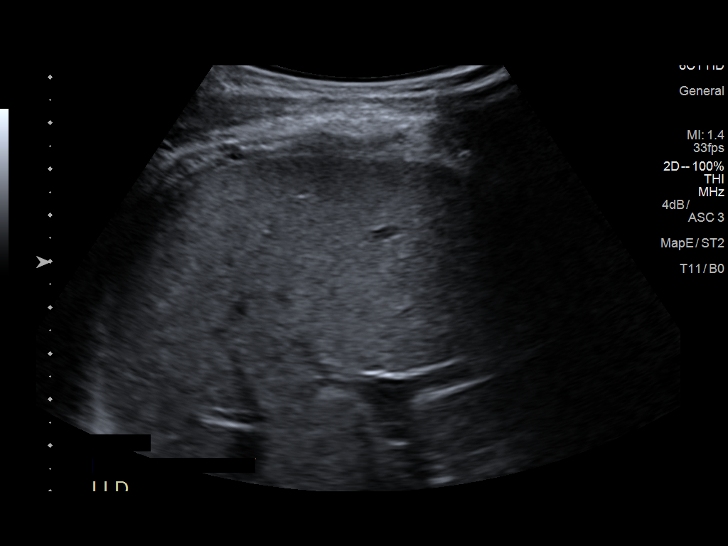
[im 13/13]
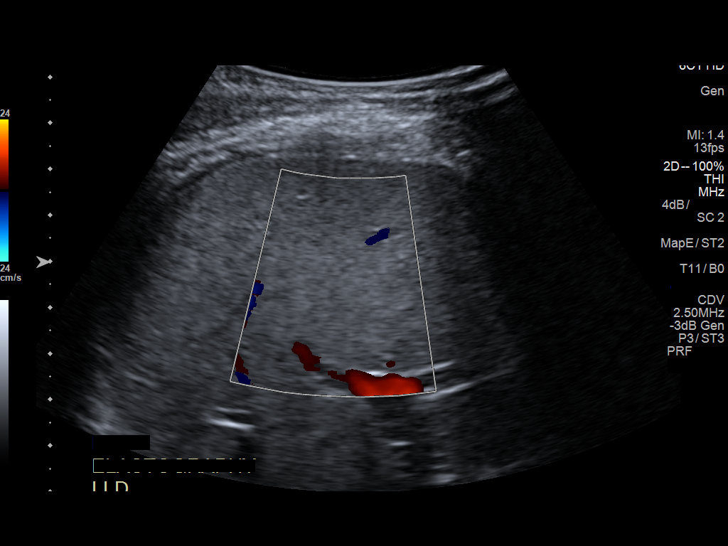

[13 of 13 positions shown; findings below may reference images not displayed]

FINDINGS: ULTRASOUND ABDOMEN

Gallbladder: No gallstones or wall thickening visualized. No
sonographic Murphy sign noted by sonographer.

Common bile duct: Diameter: 2 mm, within normal limits.

Liver: No focal lesion identified. Within normal limits in
parenchymal echogenicity. Portal vein is patent on color Doppler
imaging with normal direction of blood flow towards the liver.

IVC: No abnormality visualized.

Pancreas: Visualized portion unremarkable.

Spleen: Size and appearance within normal limits.

Right Kidney: Length: 10.7 cm. Echogenicity within normal limits. No
mass or hydronephrosis visualized.

Left Kidney: Length: 11.5 cm. Echogenicity within normal limits. No
mass or hydronephrosis visualized.

Abdominal aorta: No aneurysm visualized.

Other findings: None.

ULTRASOUND HEPATIC ELASTOGRAPHY

Device: Siemens Helix VTQ

Patient position: Left Lateral Decubitus

Transducer 6C1

Number of measurements: 10

Hepatic segment:  8

Median velocity:   1.38 m/sec

IQR:

IQR/Median velocity ratio:

Corresponding Metavir fibrosis score:  F2 + some F3

Risk of fibrosis: Moderate

Limitations of exam: None

Please note that abnormal shear wave velocities may also be
identified in clinical settings other than with hepatic fibrosis,
such as: acute hepatitis, elevated right heart and central venous
pressures including use of beta blockers, Ankit disease
(Back), infiltrative processes such as
mastocytosis/amyloidosis/infiltrative tumor, extrahepatic
cholestasis, in the post-prandial state, and liver transplantation.
Correlation with patient history, laboratory data, and clinical
condition recommended.
IMPRESSION: ULTRASOUND ABDOMEN:

Normal study. No hepatobiliary or other significant abnormality
identified.

ULTRASOUND HEPATIC ELASTOGRAPHY:

Median hepatic shear wave velocity is calculated at 1.38 m/sec.

Corresponding Metavir fibrosis score is F2 + some F3.

Risk of fibrosis is Moderate.

Follow-up: Additional testing appropriate

## 2019-10-21 ENCOUNTER — Telehealth: Payer: Self-pay | Admitting: Pharmacy Technician

## 2019-10-21 NOTE — Telephone Encounter (Addendum)
[facility] PATIENT [facility]OCATE TELEPHONE Rosezetta Schlatter, casemanager with Medicaid, called to ask if we would complete a prior authorization, to back date a claim to 2019. This is for 8 weeks of Mavyret which the patient has already taken in September and October of 2019.  Her phone number is [phone]  Mr. Sturtevant had [place] Medicaid at the time we got an approval for 8 weeks of Mavyret medication.  [place] Medicaid will not bill the patient, they are hoping that it can be rectified between them and [employer].  It was not apparent to [place] or to [facility] Medicaid at the time that he had other, primary insurance through [employer] starting 07/23/2018.  He did not state that he had other insurance.  ID: [id number] BIN: [id number] PCN: [id number] GRP: [id number]  I have completed the requested form and faxed it to [facility] for approval.  They denied.  I faxed the needed information to [phone].  The rebilling will happen between [facility] Medicaid and [employer].  Venida Jarvis. Nadara Mustard [place] Patient [facility] for Infectious Disease Phone: [phone] Fax:  [phone]

## 2019-10-26 IMAGING — US US SCROTUM W/ DOPPLER COMPLETE
1 series · 14 of 25 positions shown · non-contrast
Comparison: None.

CLINICAL DATA: Testicular pain for 3 weeks

EXAM:
SCROTAL ULTRASOUND
DOPPLER ULTRASOUND OF THE TESTICLES
TECHNIQUE: Complete ultrasound examination of the testicles, epididymis, and
other scrotal structures was performed. Color and spectral Doppler
ultrasound were also utilized to evaluate blood flow to the
testicles.

[Series 1: us scrotum w/ doppler complete · 0.08mm/px · 14 of 48 slices shown]
[im 1/48]
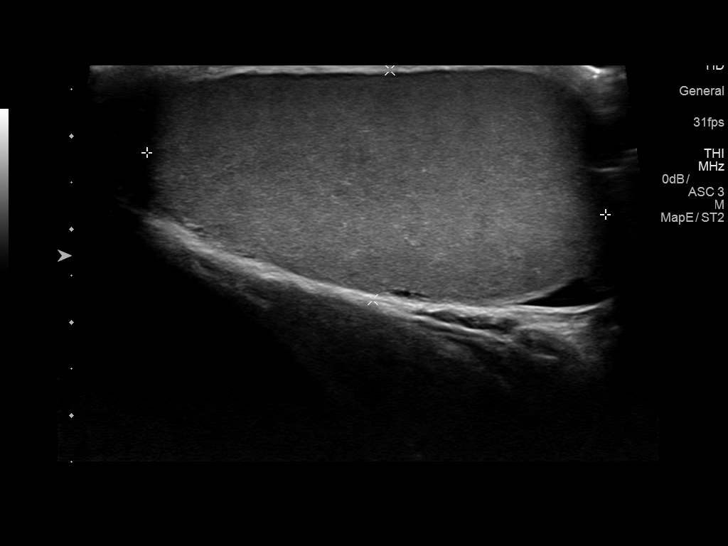
[im 4/48]
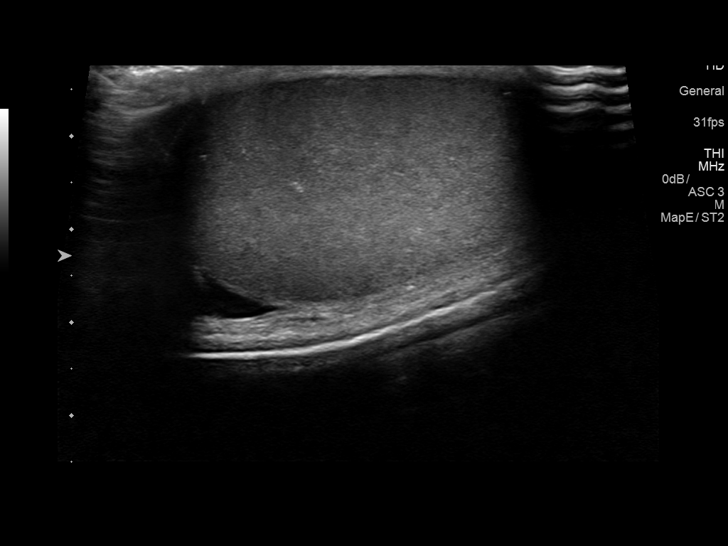
[im 8/48]
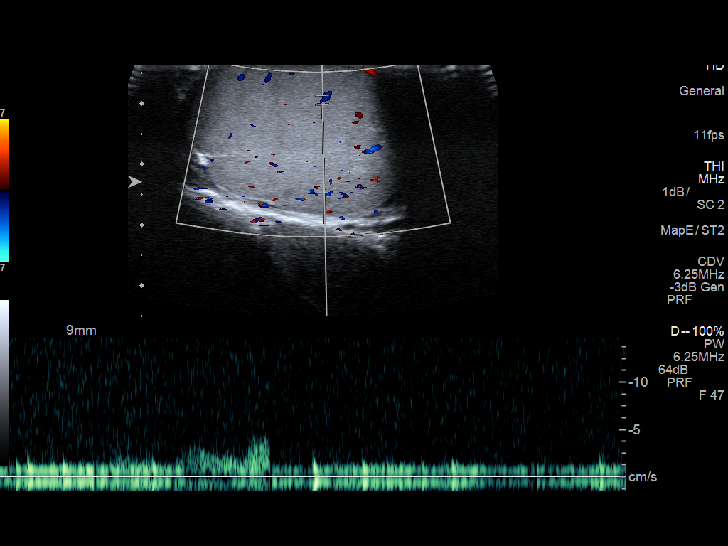
[im 12/48]
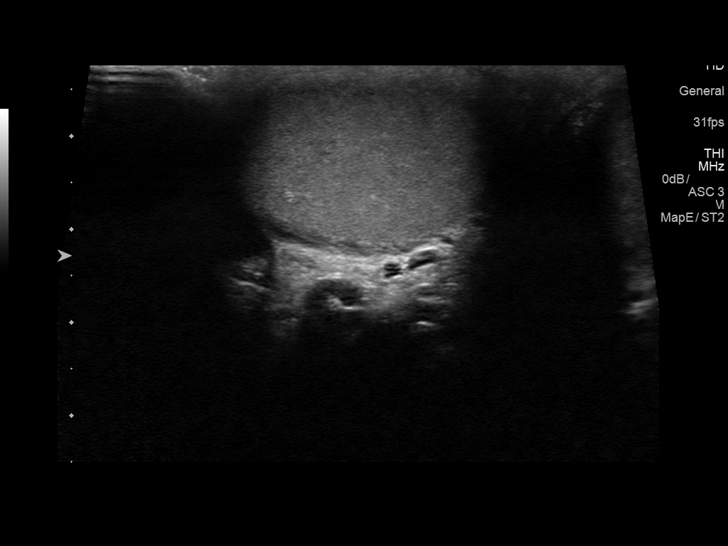
[im 16/48]
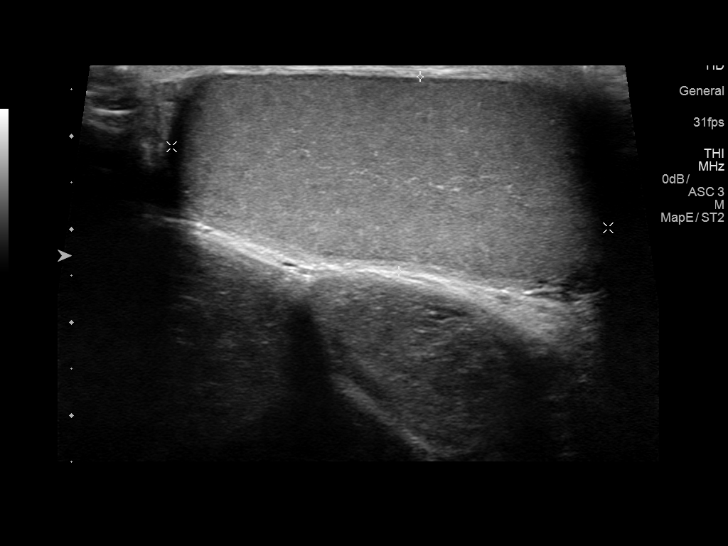
[im 18/48]
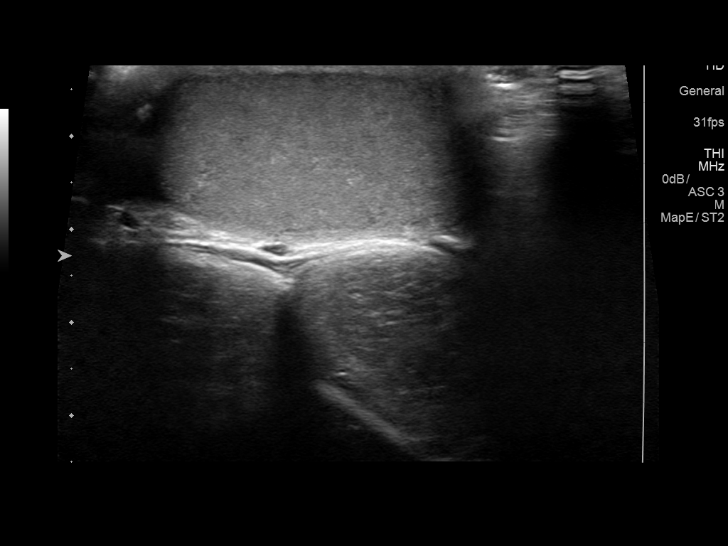
[im 22/48]
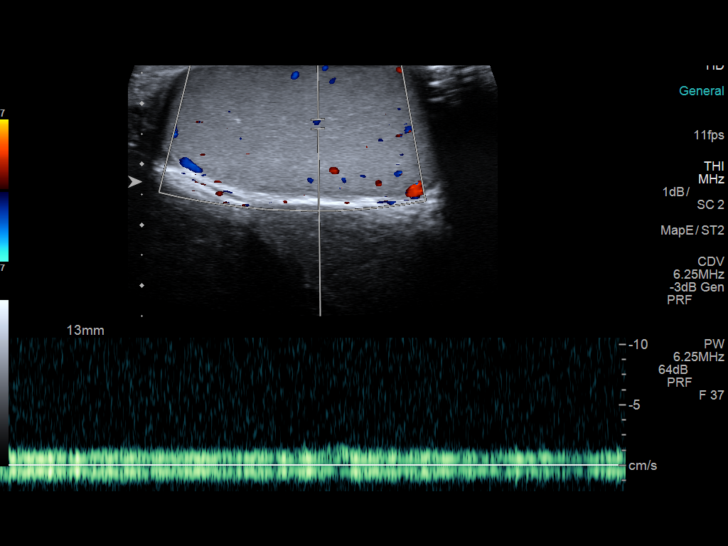
[im 26/48]
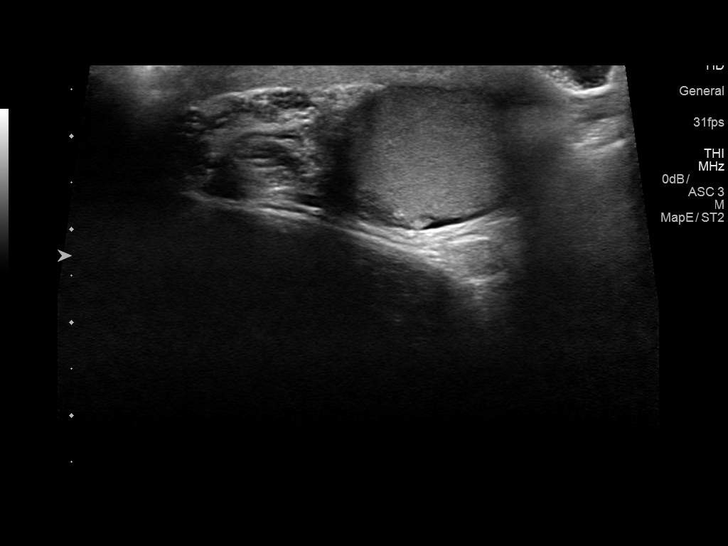
[im 30/48]
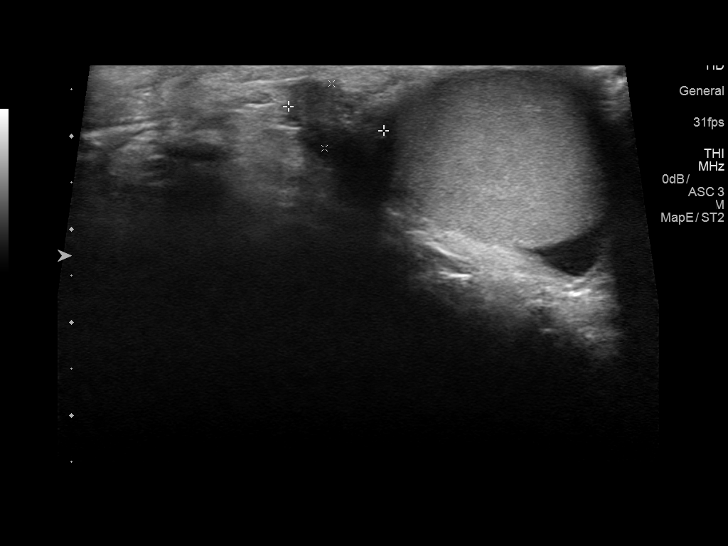
[im 32/48]
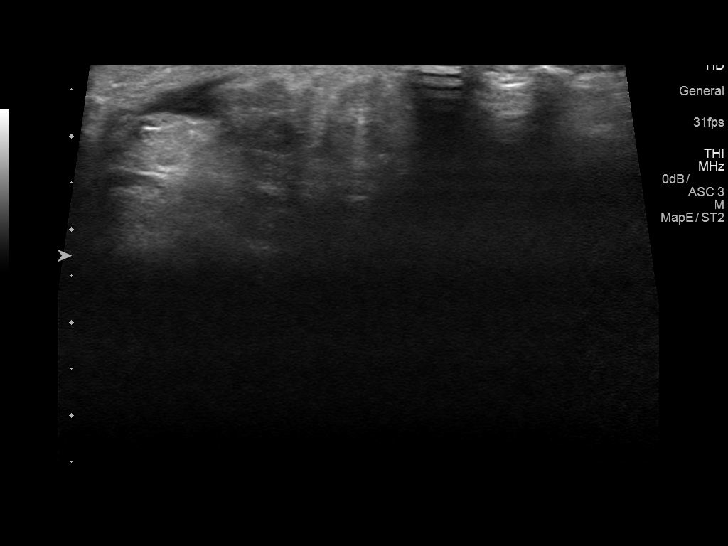
[im 36/48]
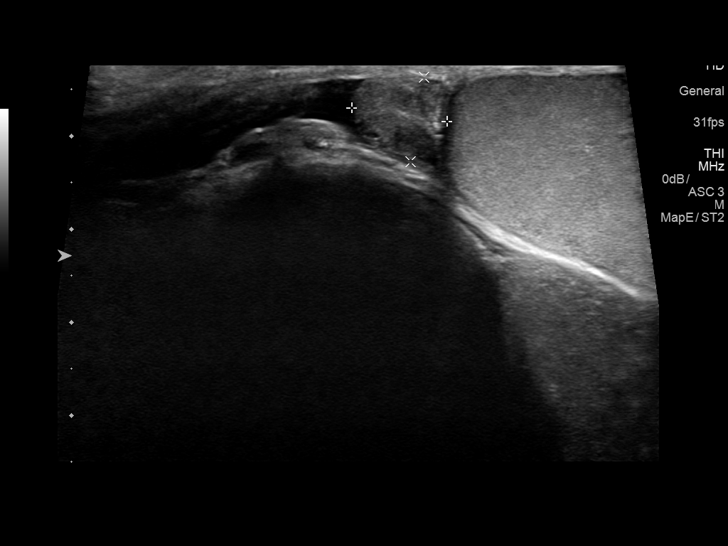
[im 40/48]
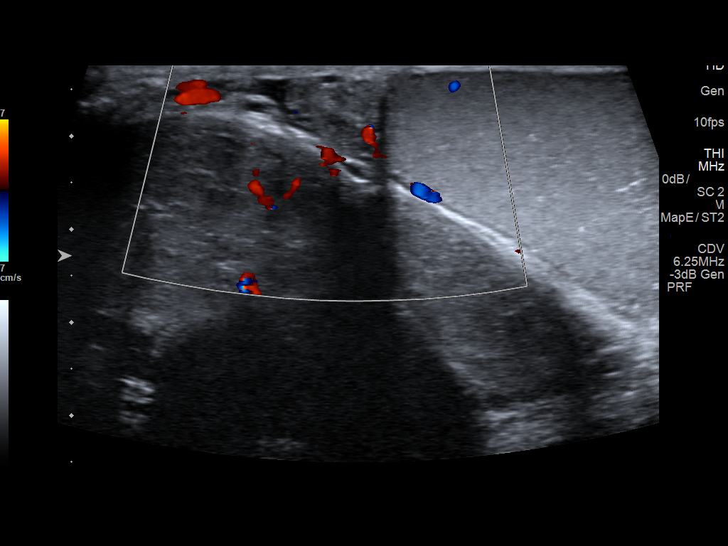
[im 44/48]
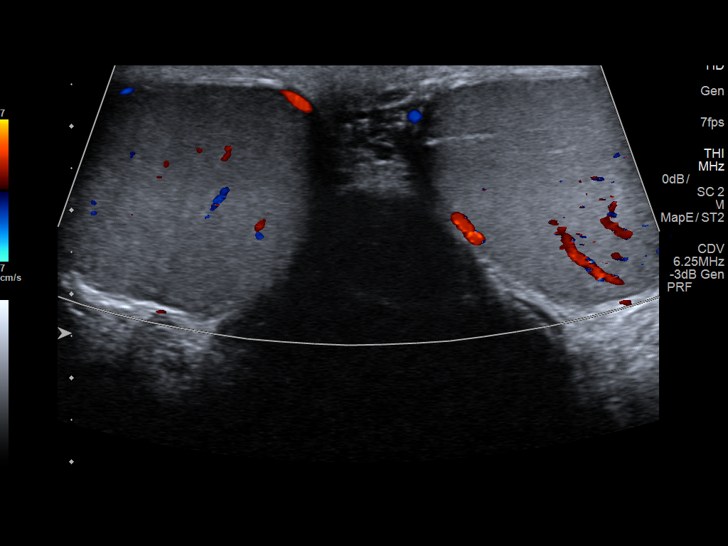
[im 48/48]
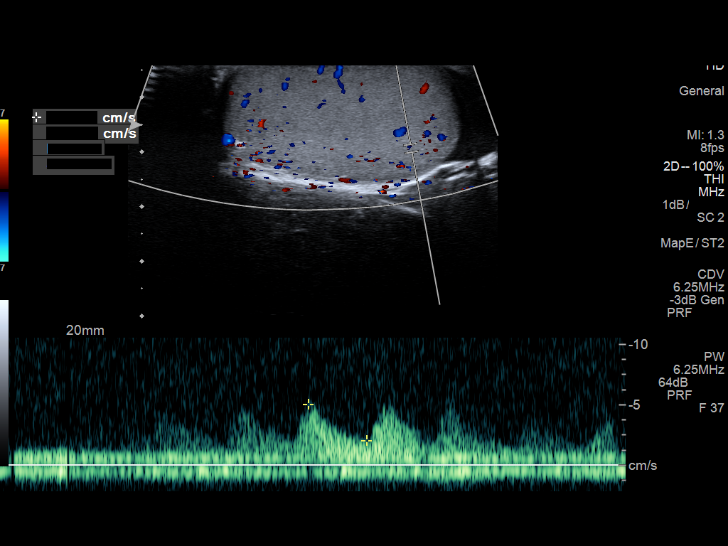

[14 of 25 positions shown; findings below may reference images not displayed]

FINDINGS: Right testicle

Measurements: 5.0 x 2.5 x 3.5 cm. No mass or microlithiasis
visualized.

Left testicle

Measurements: 4.8 x 2.1 x 3.7 cm. No mass or microlithiasis
visualized.

Right epididymis:  Normal in size and appearance.

Left epididymis:  Normal in size and appearance.

Hydrocele:  None visualized.

Varicocele:  None visualized.

Pulsed Doppler interrogation of both testes demonstrates normal low
resistance arterial and venous waveforms bilaterally.

Other: The scrotal wall measures 4.7 mm but is not hyperemic.
IMPRESSION: 1. The testicles are normal in appearance.  No evidence of torsion.
2. The scrotal wall is mildly prominent measuring 4.7 mm. The
sonographer reports that it feels hard and firm which is the
patient's complaint. No hyperemia or mass noted in the scrotal wall.

## 2019-11-23 ENCOUNTER — Other Ambulatory Visit: Payer: Self-pay

## 2019-11-23 ENCOUNTER — Encounter: Payer: Self-pay | Admitting: Family Medicine

## 2019-11-23 ENCOUNTER — Ambulatory Visit (INDEPENDENT_AMBULATORY_CARE_PROVIDER_SITE_OTHER): Payer: BC Managed Care – PPO | Admitting: Family Medicine

## 2019-11-23 ENCOUNTER — Other Ambulatory Visit (HOSPITAL_COMMUNITY)
Admission: RE | Admit: 2019-11-23 | Discharge: 2019-11-23 | Disposition: A | Discharge status: Home / Self Care | Payer: BC Managed Care – PPO | Source: Ambulatory Visit | Attending: Family Medicine | Admitting: Family Medicine

## 2019-11-23 VITALS — BP 124/72 | HR 68 | Wt 157.0 lb

## 2019-11-23 DIAGNOSIS — Z7251 High risk heterosexual behavior: Secondary | ICD-10-CM | POA: Diagnosis present

## 2019-11-23 DIAGNOSIS — R3989 Other symptoms and signs involving the genitourinary system: Secondary | ICD-10-CM | POA: Insufficient documentation

## 2019-11-23 DIAGNOSIS — Z114 Encounter for screening for human immunodeficiency virus [HIV]: Secondary | ICD-10-CM

## 2019-11-23 DIAGNOSIS — K5909 Other constipation: Secondary | ICD-10-CM

## 2019-11-23 DIAGNOSIS — Z202 Contact with and (suspected) exposure to infections with a predominantly sexual mode of transmission: Secondary | ICD-10-CM

## 2019-11-23 DIAGNOSIS — B182 Chronic viral hepatitis C: Secondary | ICD-10-CM

## 2019-11-23 DIAGNOSIS — R109 Unspecified abdominal pain: Secondary | ICD-10-CM | POA: Insufficient documentation

## 2019-11-23 DIAGNOSIS — K739 Chronic hepatitis, unspecified: Secondary | ICD-10-CM

## 2019-11-23 LAB — POCT URINALYSIS DIPSTICK
Bilirubin, UA: NEGATIVE
Blood, UA: NEGATIVE
Glucose, UA: NEGATIVE
Ketones, UA: NEGATIVE
Leukocytes, UA: NEGATIVE
Nitrite, UA: NEGATIVE
Protein, UA: NEGATIVE
Spec Grav, UA: >=1.03 — AB (ref 1.010–1.025)
Urobilinogen, UA: 0.2 E.U./dL
pH, UA: 6 (ref 5.0–8.0)

## 2019-11-23 MED ORDER — BACLOFEN 5 MG PO TABS: 5.0000 mg | ORAL_TABLET | Freq: Three times a day (TID) | ORAL | 0 refills | Status: AC | PRN

## 2019-11-23 NOTE — Assessment & Plan Note (Addendum)
Miralax discussed. Increase fruit and vegetables in diet and well as water intake. F/U in about 4 weeks for reassessment.

## 2019-11-23 NOTE — Assessment & Plan Note (Signed)
Left hypochondrium. Currently asymptomatic. Exam benign. Likely MSK pain. Tylenol as needed. Muscle relaxant prn. Advised not to use muscle relaxant at work since it can make him drowsy. He agreed with plan.

## 2019-11-23 NOTE — Assessment & Plan Note (Signed)
Counseling done. Urine GC/CHlam checked. I will call with results.

## 2019-11-23 NOTE — Assessment & Plan Note (Addendum)
Stated that his urine is yellow and he worries this is related to his Hep C infection. UA negative for bilirubin or RBC. SG slightly elevated, likely due to dehydration. Monitor for now.

## 2019-11-23 NOTE — Progress Notes (Deleted)
Subjective:     Patient ID: Brandon Wolfe, male   DOB: [birth year], 28 y.o.   MRN: [id number]  HPI Here with a certified interpreter. Still difficult to understand despite that.  Constipation: C/O constipation since 2013. He described this as reduce BM frequency and hard stool. He moves his bowel once a week. Denies blood in his stool, no N/V. He feels pain at times on his belly button and feels like a rock in his stomach, and he gets bloated. He then said he does not know what it feels like to have stomach pain. He feels that his symptoms are related to his Hep C, which he already got treated for. Pain: Left abdominal/chest pain ongoing for three months ago. Pain is on and off, worse whenever he is at work. 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 His pain is sharp in nature, about 7/10 in severity, and it does not radiate. He is currently asymptomatic. Hep C: He completed treatment. Last seen at the [facility] was seven months ago.  Urine color/STD exposure: He is concerned about the change in his urine color. He denies dysuria. No penile discharge or pain. He is sexually active and uses condoms irregularly. He will like to get an STD check. HM: He said he got a flu shot in Sept at the Pharmacy.  Current Outpatient Medications on File Prior to Visit  Medication Sig Dispense Refill  . Glecaprevir-Pibrentasvir (MAVYRET) 100-40 MG TABS Take 3 tablets by mouth daily with breakfast. (Patient not taking: Reported on 11/23/2019) 84 tablet 1  . lactulose (CHRONULAC) 10 GM/15ML solution Take 30 mLs (20 g total) by mouth 2 (two) times daily as needed for mild constipation. (Patient not taking: Reported on 11/23/2019) 240 mL 0   No current facility-administered medications on file prior to visit.    Past Medical History:  Diagnosis Date  . Chronic hepatitis C without hepatic coma (HCC) 06/17/2018      Review of Systems  Constitutional: Negative.   Respiratory:  Negative.   Cardiovascular: Negative.   Gastrointestinal: Positive for constipation.  Genitourinary: Negative for discharge, dysuria, frequency, penile pain, scrotal swelling and testicular pain.  Musculoskeletal: Negative.   Neurological: Negative.   Psychiatric/Behavioral: Negative.   All other systems reviewed and are negative.      Objective:   Physical Exam Vitals signs and nursing note reviewed.  Constitutional:      Appearance: He is not ill-appearing.  Cardiovascular:     Rate and Rhythm: Normal rate and regular rhythm.     Heart sounds: Normal heart sounds. No murmur.  Pulmonary:     Effort: Pulmonary effort is normal. No respiratory distress.     Breath sounds: Normal breath sounds. No stridor. No wheezing or rhonchi.  Abdominal:     General: Abdomen is flat. Bowel sounds are normal. There is no distension.     Palpations: Abdomen is soft. There is no mass.     Tenderness: There is no abdominal tenderness. There is no right CVA tenderness, left CVA tenderness, guarding or rebound.  Musculoskeletal:     Right lower leg: No edema.     Left lower leg: No edema.  Neurological:     General: No focal deficit present.     Mental Status: He is alert and oriented to person, place, and time.        Assessment:     Constipation Left side pain Hep C Change in urine color  STD exposure Health maintenance.    Plan:

## 2019-11-23 NOTE — Assessment & Plan Note (Signed)
Looks like he completed treatment. No ID documentation regarding f/u. Since he is so concern about did, I recommended that he calls their office for an appointment. I gave him their phone number to call.

## 2019-11-23 NOTE — Patient Instructions (Addendum)

## 2019-11-24 ENCOUNTER — Telehealth: Payer: Self-pay | Admitting: Family Medicine

## 2019-11-24 LAB — RPR: RPR Ser Ql: NONREACTIVE

## 2019-11-24 LAB — CMP14+EGFR
ALT: 24 IU/L (ref 0–44)
AST: 50 IU/L — ABNORMAL HIGH (ref 0–40)
Albumin/Globulin Ratio: 1.5 (ref 1.2–2.2)
Albumin: 4.5 g/dL (ref 4.1–5.2)
Alkaline Phosphatase: 51 IU/L (ref 39–117)
BUN/Creatinine Ratio: 8 — ABNORMAL LOW (ref 9–20)
BUN: 7 mg/dL (ref 6–20)
Bilirubin Total: 0.4 mg/dL (ref 0.0–1.2)
CO2: 25 mmol/L (ref 20–29)
Calcium: 9.3 mg/dL (ref 8.7–10.2)
Chloride: 102 mmol/L (ref 96–106)
Creatinine, Ser: 0.92 mg/dL (ref 0.76–1.27)
GFR calc Af Amer: 131 mL/min/{1.73_m2} (ref >59)
GFR calc non Af Amer: 114 mL/min/{1.73_m2} (ref >59)
Globulin, Total: 3.1 g/dL (ref 1.5–4.5)
Glucose: 100 mg/dL — ABNORMAL HIGH (ref 65–99)
Potassium: 4.3 mmol/L (ref 3.5–5.2)
Sodium: 139 mmol/L (ref 134–144)
Total Protein: 7.6 g/dL (ref 6.0–8.5)

## 2019-11-24 LAB — HIV ANTIBODY (ROUTINE TESTING W REFLEX): HIV Screen 4th Generation wRfx: NONREACTIVE

## 2019-11-24 NOTE — Progress Notes (Signed)
Patient ID: Brandon Wolfe, male   DOB: January 01, 1991, 28 y.o.   MRN: [id number]  Subjective:     Patient ID: Brandon Wolfe, male   DOB: [birth year], 28 y.o.   MRN: [id number]  HPI Here with a certified interpreter. Still difficult to understand despite that.  Constipation: C/O constipation since 2013. He described this as reduce BM frequency and hard stool. He moves his bowel once a week. Denies blood in his stool, no N/V. He feels pain at times on his belly button and feels like a rock in his stomach, and he gets bloated. He then said he does not know what it feels like to have stomach pain. He feels that his symptoms are related to his Hep C, which he already got treated for. Pain: Left abdominal/chest pain ongoing for three months ago. Pain is on and off, worse whenever he is at work. 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 His pain is sharp in nature, about 7/10 in severity, and it does not radiate. He is currently asymptomatic. Hep C: He completed treatment. Last seen at the [facility] was seven months ago.  Urine color/STD exposure: He is concerned about the change in his urine color. He denies dysuria. No penile discharge or pain. He is sexually active and uses condoms irregularly. He will like to get an STD check. HM: He said he got a flu shot in Sept at the Pharmacy.  Current Outpatient Medications on File Prior to Visit  Medication Sig Dispense Refill  . Glecaprevir-Pibrentasvir (MAVYRET) 100-40 MG TABS Take 3 tablets by mouth daily with breakfast. (Patient not taking: Reported on 11/23/2019) 84 tablet 1  . lactulose (CHRONULAC) 10 GM/15ML solution Take 30 mLs (20 g total) by mouth 2 (two) times daily as needed for mild constipation. (Patient not taking: Reported on 11/23/2019) 240 mL 0   No current facility-administered medications on file prior to visit.    Past Medical History:  Diagnosis Date  . Chronic hepatitis C without hepatic coma (HCC)  06/17/2018      Review of Systems  Constitutional: Negative.   Respiratory: Negative.   Cardiovascular: Negative.   Gastrointestinal: Positive for constipation.  Genitourinary: Negative for discharge, dysuria, frequency, penile pain, scrotal swelling and testicular pain.  Musculoskeletal: Negative.   Neurological: Negative.   Psychiatric/Behavioral: Negative.   All other systems reviewed and are negative.      Objective:   Physical Exam Vitals signs and nursing note reviewed.  Constitutional:      Appearance: He is not ill-appearing.  Cardiovascular:     Rate and Rhythm: Normal rate and regular rhythm.     Heart sounds: Normal heart sounds. No murmur.  Pulmonary:     Effort: Pulmonary effort is normal. No respiratory distress.     Breath sounds: Normal breath sounds. No stridor. No wheezing or rhonchi.  Abdominal:     General: Abdomen is flat. Bowel sounds are normal. There is no distension.     Palpations: Abdomen is soft. There is no mass.     Tenderness: There is no abdominal tenderness. There is no right CVA tenderness, left CVA tenderness, guarding or rebound.  Musculoskeletal:     Right lower leg: No edema.     Left lower leg: No edema.  Neurological:     General: No focal deficit present.     Mental Status: He is alert and oriented to person, place, and time.  Assessment:     Constipation Left side pain Hep C Change in urine color STD exposure Health maintenance.    Plan:     Check problem list.

## 2019-11-24 NOTE — Telephone Encounter (Signed)
Attempted to reach him about test result.  [place] interpreter ID#: [phone]  HIPAA compliant callback message left.

## 2019-11-25 LAB — URINE CYTOLOGY ANCILLARY ONLY
Chlamydia: NEGATIVE
Comment: NEGATIVE
Comment: NEGATIVE
Comment: NORMAL
Neisseria Gonorrhea: NEGATIVE
Trichomonas: NEGATIVE

## 2019-12-27 ENCOUNTER — Encounter: Payer: Self-pay | Admitting: Family Medicine

## 2019-12-28 ENCOUNTER — Ambulatory Visit (INDEPENDENT_AMBULATORY_CARE_PROVIDER_SITE_OTHER): Payer: Self-pay | Admitting: Family Medicine

## 2019-12-28 ENCOUNTER — Other Ambulatory Visit: Payer: Self-pay

## 2019-12-28 ENCOUNTER — Encounter: Payer: Self-pay | Admitting: Family Medicine

## 2019-12-28 DIAGNOSIS — B182 Chronic viral hepatitis C: Secondary | ICD-10-CM

## 2019-12-28 DIAGNOSIS — R109 Unspecified abdominal pain: Secondary | ICD-10-CM

## 2019-12-28 NOTE — Assessment & Plan Note (Signed)
No acute change. Seems he completed treatment. Still a lot of concerns and questions. I discussed his Cmet result with midly elevated AST of 50 which is almost at his baseline. We can certainly monitor and recheck in the next 6-12 months. I again gave him [facility] address and phone number to call to schedule his f/u appointment. He agreed with the plan and verbalized understanding. All other test results reviewed and was discussed with him.

## 2019-12-28 NOTE — Assessment & Plan Note (Signed)
Resolved. He declined refill of his Flexeril. F/U as needed.

## 2019-12-28 NOTE — Progress Notes (Addendum)
  Subjective:     Patient ID: Brandon Wolfe, male   DOB: [birth year], 28 y.o.   MRN: [id number] Interpreter Zara Chess)  Flank Pain This is a chronic problem. Episode onset: Months. The problem occurs intermittently. The problem has been gradually improving since onset. Pain location: Left hypochondriac or flank. The pain does not radiate. The pain is at a severity of 0/10 (Currently no pain. At time his pain is sharp and about 6/10 in severity). The patient is experiencing no pain. Exacerbated by: Nothing. Pertinent negatives include no abdominal pain, chest pain, dysuria or fever. He has tried muscle relaxant for the symptoms. The treatment provided significant relief.  He does not want to continue muscle relaxant. He is still concern about Hepatitis C infection. He is yet to f/u with [facility]. F/U lab: He will like to know his lab results.   Review of Systems  Constitutional: Negative for fever.  Respiratory: Negative.   Cardiovascular: Negative.  Negative for chest pain.  Gastrointestinal: Negative for abdominal pain.  Genitourinary: Positive for flank pain. Negative for dysuria.  Neurological: Negative.   All other systems reviewed and are negative.      Objective:   Physical Exam Vitals and nursing note reviewed.  Cardiovascular:     Rate and Rhythm: Normal rate and regular rhythm.     Heart sounds: Normal heart sounds. No murmur.  Pulmonary:     Effort: Pulmonary effort is normal. No respiratory distress.     Breath sounds: Normal breath sounds. No stridor. No wheezing or rhonchi.  Abdominal:     General: Abdomen is flat. Bowel sounds are normal. There is no distension.     Palpations: Abdomen is soft. There is no mass.     Tenderness: There is no abdominal tenderness. There is no right CVA tenderness or left CVA tenderness.  Musculoskeletal:        General: Normal range of motion.  Neurological:     Mental Status: He is alert.     Office Visit from 12/28/2019 in [facility]  PHQ-2 Total Score  0     Late entry, PHQ2 completed during visit with the help of an interpreter.     Assessment:     Abdominal pain Chronic hep C    Plan:     Check problem list.

## 2019-12-28 NOTE — Patient Instructions (Addendum)
It was nice seeing you today. I am glad your pain is better now. We can watch for now.  Follow-up as needed.  [facility] Loc[facility] in: [facility] Address: [address], [place], [place] [address] Hours:  Open ? Closes 5PM Phone: [phone]

## 2020-02-28 NOTE — Telephone Encounter (Signed)
[facility] PATIENT [facility] TELEPHONE ENCOUNTER  Tobi Harrell with Medicaid, called to ask if we would complete a prior authorization, to back date a claim to 2019. This is for 8 weeks of Mavyret which the patient has already taken in September and October of 2019.  I had done this already, but agreed to complete it and refax to them.  Her phone number is [phone]  Mr. Tormey had [place] Medicaid at the time we got an approval for 8 weeks of Mavyret medication.  [place] Medicaid will not bill the patient, they are hoping that it can be rectified between them and [employer].  It was not apparent to [place] or to [facility] Medicaid at the time that he had other, primary insurance through [employer] starting 07/23/2018.  He did not state that he had other insurance.  ID:        [id number] BIN:     [id number] PCN:    ADV GRP:   [id number]  I faxed the requested information to [phone].  The rebilling will happen between [employer] Medicaid and [employer].  Netty Starring. Dimas Aguas [facility]PhT [facility] Patient [facility] for Infectious Disease Phone: [phone] Fax:  [phone]

## 2021-07-30 ENCOUNTER — Other Ambulatory Visit: Payer: Self-pay
